# Patient Record
Sex: Female | Born: 1960 | Race: White | Hispanic: No | Smoking: Never smoker
Health system: Southern US, Community
[De-identification: ages and names within clinical notes are randomized; demographics above are authoritative.]

## PROBLEM LIST (undated history)

## (undated) DIAGNOSIS — O24419 Gestational diabetes mellitus in pregnancy, unspecified control: Secondary | ICD-10-CM

## (undated) DIAGNOSIS — O039 Complete or unspecified spontaneous abortion without complication: Secondary | ICD-10-CM

## (undated) DIAGNOSIS — O1490 Unspecified pre-eclampsia, unspecified trimester: Secondary | ICD-10-CM

## (undated) HISTORY — PX: DILATION AND CURETTAGE OF UTERUS: SHX78

---

## 1997-10-17 ENCOUNTER — Emergency Department (HOSPITAL_COMMUNITY): Admission: EM | Admit: 1997-10-17 | Discharge: 1997-10-17 | Payer: Self-pay | Admitting: Emergency Medicine

## 1999-09-20 ENCOUNTER — Encounter (INDEPENDENT_AMBULATORY_CARE_PROVIDER_SITE_OTHER): Payer: Self-pay

## 1999-09-20 ENCOUNTER — Ambulatory Visit (HOSPITAL_COMMUNITY): Admission: RE | Admit: 1999-09-20 | Discharge: 1999-09-20 | Payer: Self-pay | Admitting: Obstetrics & Gynecology

## 1999-11-06 ENCOUNTER — Other Ambulatory Visit: Admission: RE | Admit: 1999-11-06 | Discharge: 1999-11-06 | Payer: Self-pay | Admitting: Obstetrics and Gynecology

## 2000-02-03 ENCOUNTER — Encounter: Payer: Self-pay | Admitting: Obstetrics and Gynecology

## 2000-02-03 ENCOUNTER — Encounter: Admission: RE | Admit: 2000-02-03 | Discharge: 2000-02-03 | Payer: Self-pay | Admitting: Obstetrics and Gynecology

## 2000-08-25 ENCOUNTER — Other Ambulatory Visit: Admission: RE | Admit: 2000-08-25 | Discharge: 2000-08-25 | Payer: Self-pay | Admitting: Obstetrics and Gynecology

## 2000-11-06 ENCOUNTER — Encounter: Payer: Self-pay | Admitting: Obstetrics and Gynecology

## 2000-11-06 ENCOUNTER — Ambulatory Visit (HOSPITAL_COMMUNITY): Admission: RE | Admit: 2000-11-06 | Discharge: 2000-11-06 | Payer: Self-pay | Admitting: Obstetrics and Gynecology

## 2001-02-09 ENCOUNTER — Inpatient Hospital Stay (HOSPITAL_COMMUNITY): Admission: AD | Admit: 2001-02-09 | Discharge: 2001-02-09 | Payer: Self-pay | Admitting: Obstetrics and Gynecology

## 2001-02-18 ENCOUNTER — Encounter: Payer: Self-pay | Admitting: Obstetrics and Gynecology

## 2001-02-18 ENCOUNTER — Ambulatory Visit (HOSPITAL_COMMUNITY): Admission: RE | Admit: 2001-02-18 | Discharge: 2001-02-18 | Payer: Self-pay | Admitting: Obstetrics and Gynecology

## 2001-02-21 ENCOUNTER — Inpatient Hospital Stay (HOSPITAL_COMMUNITY): Admission: AD | Admit: 2001-02-21 | Discharge: 2001-02-23 | Payer: Self-pay | Admitting: Obstetrics and Gynecology

## 2001-02-24 ENCOUNTER — Encounter: Admission: RE | Admit: 2001-02-24 | Discharge: 2001-03-26 | Payer: Self-pay | Admitting: Obstetrics and Gynecology

## 2001-03-26 ENCOUNTER — Other Ambulatory Visit: Admission: RE | Admit: 2001-03-26 | Discharge: 2001-03-26 | Payer: Self-pay | Admitting: Obstetrics and Gynecology

## 2001-03-27 ENCOUNTER — Encounter: Admission: RE | Admit: 2001-03-27 | Discharge: 2001-04-26 | Payer: Self-pay | Admitting: Obstetrics and Gynecology

## 2001-05-12 ENCOUNTER — Encounter: Admission: RE | Admit: 2001-05-12 | Discharge: 2001-06-11 | Payer: Self-pay | Admitting: Obstetrics and Gynecology

## 2001-06-12 ENCOUNTER — Encounter: Admission: RE | Admit: 2001-06-12 | Discharge: 2001-07-12 | Payer: Self-pay | Admitting: Obstetrics and Gynecology

## 2001-07-13 ENCOUNTER — Encounter: Admission: RE | Admit: 2001-07-13 | Discharge: 2001-08-12 | Payer: Self-pay | Admitting: Obstetrics and Gynecology

## 2001-08-25 ENCOUNTER — Encounter: Admission: RE | Admit: 2001-08-25 | Discharge: 2001-09-24 | Payer: Self-pay | Admitting: Obstetrics and Gynecology

## 2001-10-10 ENCOUNTER — Encounter: Admission: RE | Admit: 2001-10-10 | Discharge: 2001-11-09 | Payer: Self-pay | Admitting: Obstetrics and Gynecology

## 2001-11-10 ENCOUNTER — Encounter: Admission: RE | Admit: 2001-11-10 | Discharge: 2001-12-10 | Payer: Self-pay | Admitting: Obstetrics and Gynecology

## 2001-12-25 ENCOUNTER — Encounter: Admission: RE | Admit: 2001-12-25 | Discharge: 2002-01-24 | Payer: Self-pay | Admitting: Obstetrics and Gynecology

## 2002-01-25 ENCOUNTER — Encounter: Admission: RE | Admit: 2002-01-25 | Discharge: 2002-02-24 | Payer: Self-pay | Admitting: Obstetrics and Gynecology

## 2003-04-07 ENCOUNTER — Other Ambulatory Visit: Admission: RE | Admit: 2003-04-07 | Discharge: 2003-04-07 | Payer: Self-pay | Admitting: Obstetrics and Gynecology

## 2003-08-29 ENCOUNTER — Encounter: Admission: RE | Admit: 2003-08-29 | Discharge: 2003-08-29 | Payer: Self-pay | Admitting: Obstetrics and Gynecology

## 2003-10-03 ENCOUNTER — Encounter: Payer: Self-pay | Admitting: Obstetrics and Gynecology

## 2003-10-03 ENCOUNTER — Ambulatory Visit (HOSPITAL_COMMUNITY): Admission: RE | Admit: 2003-10-03 | Discharge: 2003-10-03 | Payer: Self-pay | Admitting: Obstetrics and Gynecology

## 2003-10-11 ENCOUNTER — Ambulatory Visit (HOSPITAL_COMMUNITY): Admission: RE | Admit: 2003-10-11 | Discharge: 2003-10-11 | Payer: Self-pay | Admitting: Obstetrics and Gynecology

## 2003-10-19 ENCOUNTER — Ambulatory Visit (HOSPITAL_COMMUNITY): Admission: RE | Admit: 2003-10-19 | Discharge: 2003-10-19 | Payer: Self-pay | Admitting: Obstetrics and Gynecology

## 2003-10-20 ENCOUNTER — Inpatient Hospital Stay (HOSPITAL_COMMUNITY): Admission: RE | Admit: 2003-10-20 | Discharge: 2003-10-23 | Payer: Self-pay | Admitting: Obstetrics and Gynecology

## 2003-10-24 ENCOUNTER — Encounter: Admission: RE | Admit: 2003-10-24 | Discharge: 2003-11-23 | Payer: Self-pay | Admitting: Obstetrics and Gynecology

## 2003-11-24 ENCOUNTER — Encounter: Admission: RE | Admit: 2003-11-24 | Discharge: 2003-12-24 | Payer: Self-pay | Admitting: Obstetrics and Gynecology

## 2003-12-25 ENCOUNTER — Encounter: Admission: RE | Admit: 2003-12-25 | Discharge: 2004-01-24 | Payer: Self-pay | Admitting: Obstetrics and Gynecology

## 2004-01-25 ENCOUNTER — Encounter: Admission: RE | Admit: 2004-01-25 | Discharge: 2004-02-24 | Payer: Self-pay | Admitting: Obstetrics and Gynecology

## 2004-03-26 ENCOUNTER — Encounter: Admission: RE | Admit: 2004-03-26 | Discharge: 2004-04-25 | Payer: Self-pay | Admitting: Obstetrics and Gynecology

## 2004-04-12 ENCOUNTER — Ambulatory Visit: Payer: Self-pay | Admitting: Internal Medicine

## 2004-05-26 ENCOUNTER — Encounter: Admission: RE | Admit: 2004-05-26 | Discharge: 2004-06-25 | Payer: Self-pay | Admitting: Obstetrics and Gynecology

## 2004-06-26 ENCOUNTER — Encounter: Admission: RE | Admit: 2004-06-26 | Discharge: 2004-07-26 | Payer: Self-pay | Admitting: Obstetrics and Gynecology

## 2004-07-16 ENCOUNTER — Ambulatory Visit: Payer: Self-pay | Admitting: Internal Medicine

## 2004-07-16 ENCOUNTER — Encounter: Admission: RE | Admit: 2004-07-16 | Discharge: 2004-07-16 | Payer: Self-pay | Admitting: Internal Medicine

## 2004-08-24 ENCOUNTER — Encounter: Admission: RE | Admit: 2004-08-24 | Discharge: 2004-09-23 | Payer: Self-pay | Admitting: Obstetrics and Gynecology

## 2004-10-24 ENCOUNTER — Encounter: Admission: RE | Admit: 2004-10-24 | Discharge: 2004-11-23 | Payer: Self-pay | Admitting: Obstetrics and Gynecology

## 2004-12-24 ENCOUNTER — Encounter: Admission: RE | Admit: 2004-12-24 | Discharge: 2005-01-23 | Payer: Self-pay | Admitting: Obstetrics and Gynecology

## 2005-01-24 ENCOUNTER — Encounter: Admission: RE | Admit: 2005-01-24 | Discharge: 2005-02-22 | Payer: Self-pay | Admitting: Obstetrics and Gynecology

## 2005-02-07 ENCOUNTER — Other Ambulatory Visit: Admission: RE | Admit: 2005-02-07 | Discharge: 2005-02-07 | Payer: Self-pay | Admitting: Obstetrics and Gynecology

## 2005-02-23 ENCOUNTER — Encounter: Admission: RE | Admit: 2005-02-23 | Discharge: 2005-03-10 | Payer: Self-pay | Admitting: Obstetrics and Gynecology

## 2005-02-25 ENCOUNTER — Ambulatory Visit (HOSPITAL_COMMUNITY): Admission: RE | Admit: 2005-02-25 | Discharge: 2005-02-25 | Payer: Self-pay | Admitting: Obstetrics and Gynecology

## 2005-03-07 ENCOUNTER — Encounter: Admission: RE | Admit: 2005-03-07 | Discharge: 2005-03-07 | Payer: Self-pay | Admitting: Obstetrics and Gynecology

## 2005-06-27 IMAGING — CR DG ANKLE COMPLETE 3+V*R*
3 series · 3 of 3 positions shown · non-contrast
Comparison: none

CLINICAL DATA: Pain and swelling after trauma.  
 RIGHT ANKLE: 
 Diffuse soft swelling is present about the ankle.  There are degenerative changes at the ankle joint.  Posterior and plantar calcaneal spurring are seen.  There is linear ossification at the posterior tibial malleolus which has the appearance of periosteal reaction.

[view not recorded (1 of 3)]
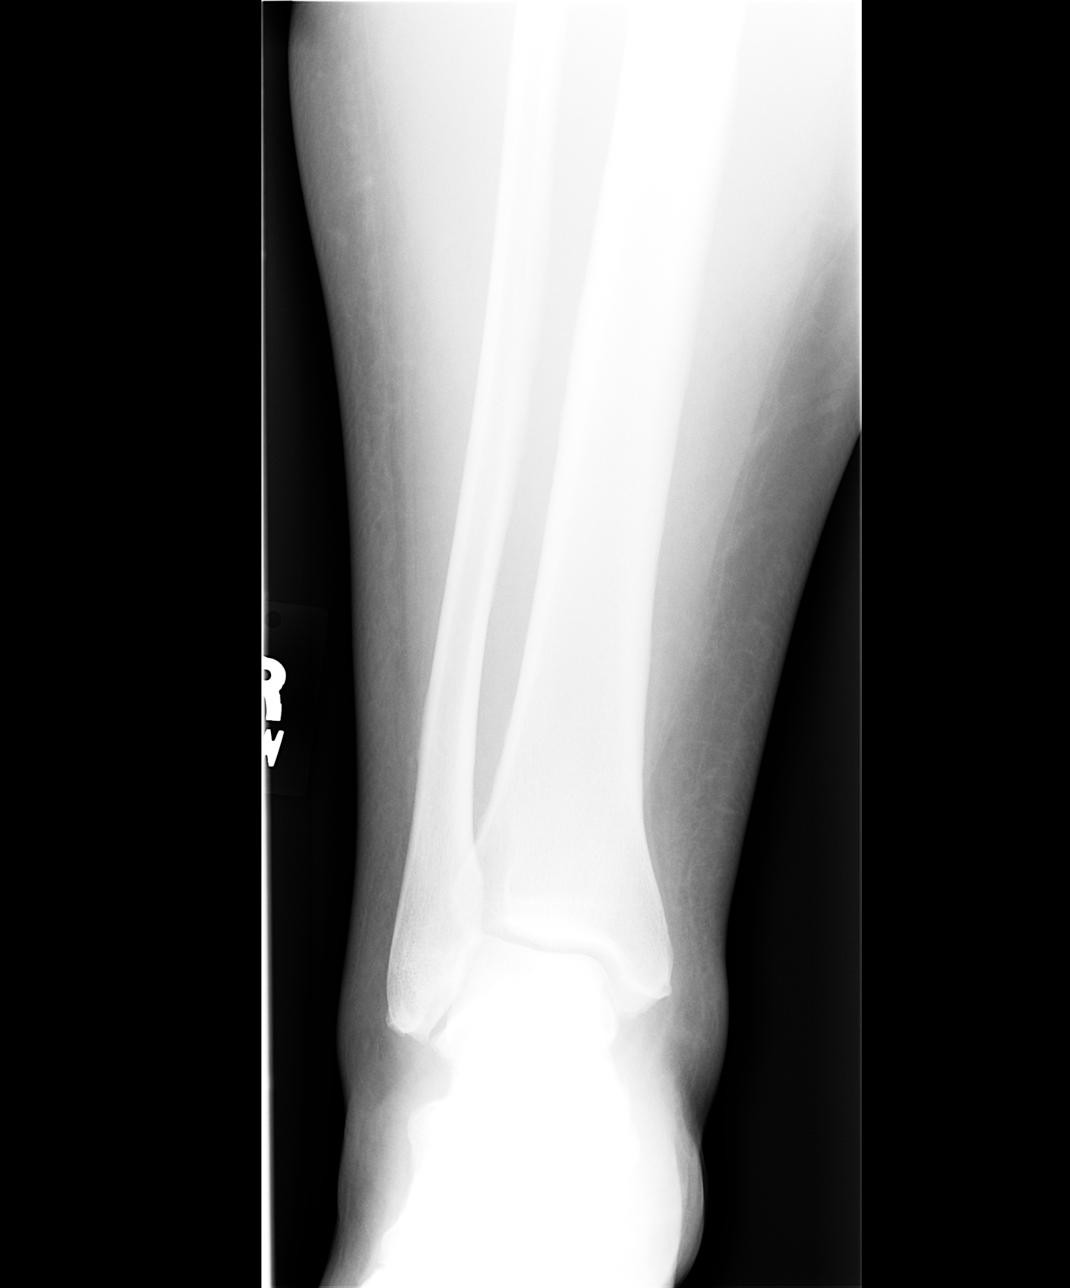

[view not recorded (2 of 3)]
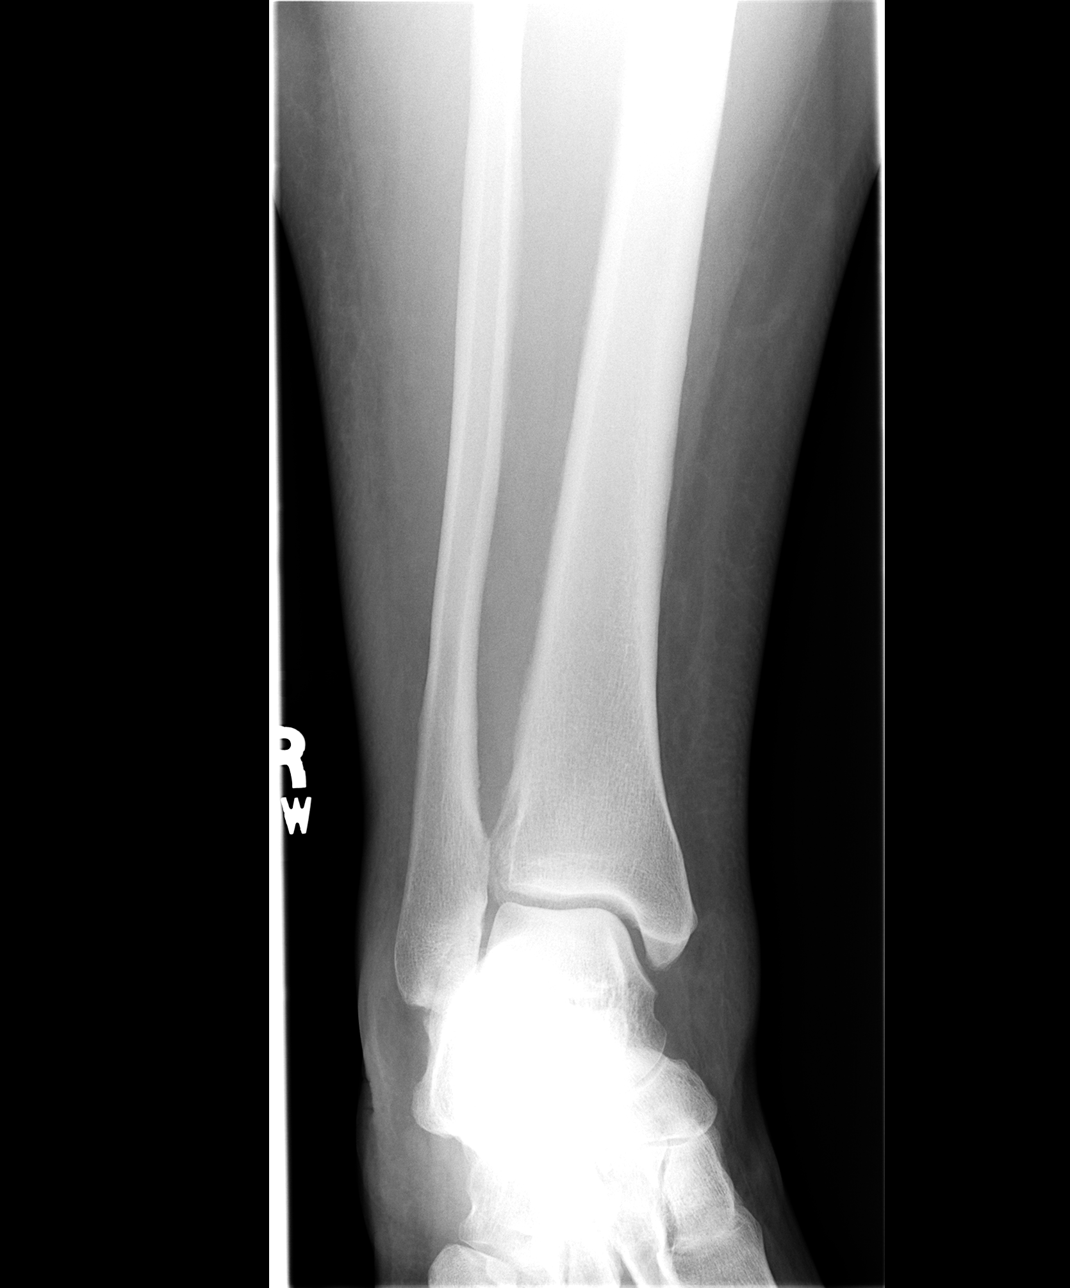

[view not recorded (3 of 3)]
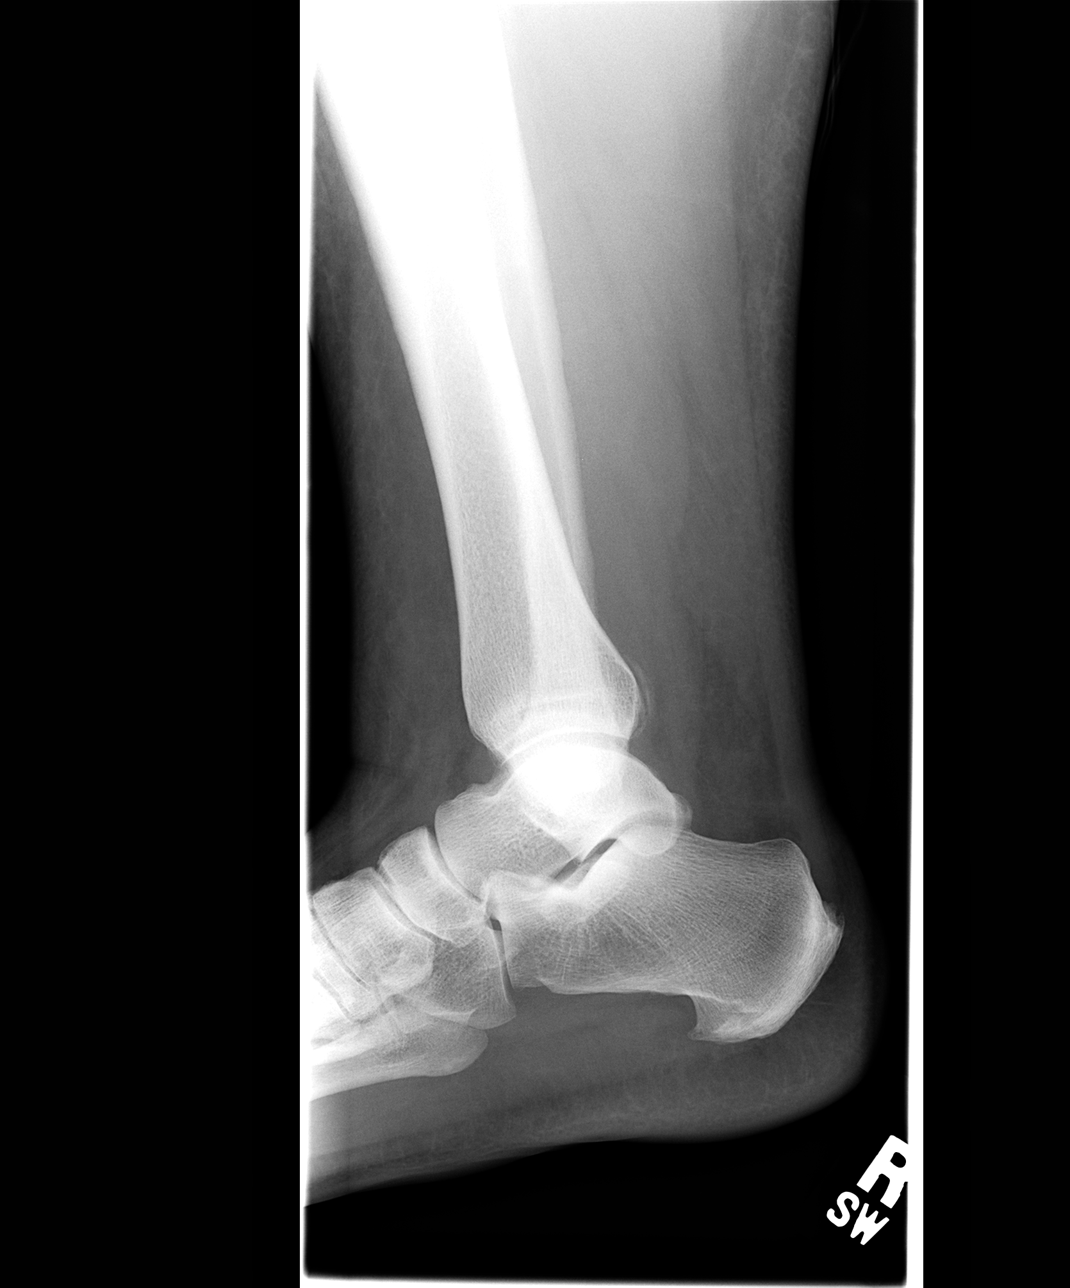

[3 of 3 positions shown; findings below may reference images not displayed]

IMPRESSION: 1.  Ossification at the posterior malleolus has the appearance of periosteal reaction, most likely from trauma.  A discrete fracture line cannot be identified, however.  
 2.  Diffuse swelling about the ankle.  
 3.  Hypertrophic degenerative changes.

## 2006-05-22 ENCOUNTER — Ambulatory Visit (HOSPITAL_COMMUNITY): Admission: RE | Admit: 2006-05-22 | Discharge: 2006-05-22 | Payer: Self-pay | Admitting: Obstetrics and Gynecology

## 2008-03-31 ENCOUNTER — Encounter: Admission: RE | Admit: 2008-03-31 | Discharge: 2008-03-31 | Payer: Self-pay | Admitting: Obstetrics and Gynecology

## 2010-10-11 NOTE — H&P (Signed)
NAME:  Amanda Graves, Amanda Graves NO.:  [id number]   MEDICAL RECORD NO.:  [id number]                   PATIENT TYPE:  OUT   LOCATION:  ULT                                  FACILITY:  [facility]   PHYSICIAN:  Crist Fat. Rivard, M.D.              DATE OF BIRTH:  24-Jun-1960   DATE OF ADMISSION:  10/19/2003  DATE OF DISCHARGE:                                HISTORY & PHYSICAL   This a 50 year old gravida 4, para 1, 0 2 1 at 28 and 0/7 weeks who presents  for an elective primary cesarean section.  Her pregnancy has been followed  by Dr. Estanislado Pandy and remarkable for:  1. AMA.  2. Insulin-dependent diabetes.  3. History of a 36-week delivery.  4. Obesity.  5. History of PIH.  6. Conception on Clomid.  7. Group B strep negative.    OB HISTORY:  Remarkable for spontaneous abortions in 1984 and 2001.  She had  a vacuum assisted delivery in 2002 of a female infant at [redacted] weeks gestation,  weighing 7 lb, 12 oz, remarkable for pregnancy induced hypertension and  difficult delivery with a third-degree laceration.   MEDICAL HISTORY:  Remarkable for:  1. Pregnancy induced hypertension.  2. History of fibrocystic breasts.  3. History of infertility, treated with Clomid and IUI.   SURGICAL HISTORY:  Remarkable for wisdom teeth in 2000.   FAMILY HISTORY:  Remarkable for a mother with heart disease, hypertension,  and colon and bladder cancer.  Brother has COPD.  Mother has diabetes and  hypothyroidism.  Father has jaw cancer and prostate cancer.  Grandmother has  breast cancer.   GENETIC HISTORY:  Unremarkable.   SOCIAL HISTORY:  The patient is married to Wardell Heath who is involved and  supportive.  She is of the [employer].  She denies any alcohol,  tobacco, or drug use.   PRENATAL LABORATORY STUDIES:  Hemoglobin 13.1.  Platelets 229,000.  Blood  type O positive.  Antibody screen negative.  RPR nonreactive.  Rubella  immune.  Hepatitis negative.  HIV negative.  Toxo  titer is negative.  Group  B strep negative.   HISTORY OF CURRENT PREGNANCY:  The patient entered care at [redacted] weeks  gestation.  She was on insulin throughout the pregnancy and the doses were  adjusted according to her increasing needs.  She had some preterm  contractions at 19 weeks, which resolved.  She had some episodes of pink  discharge at 24 weeks with a negative fetal fibronectin.  Her diabetes was  gestational and she was placed on glucose control.  Insulin dosages needed  to be increased throughout the pregnancy to gain control.  The decision was  made for an elective C-section for this delivery and the patient is  currently preparing for that.   ASSESSMENT:  1. Intrauterine pregnancy at 38 weeks.  2. Previous difficult vaginal delivery.  3. Desires elective primary cesarean section.  PLAN:  1. Admit to operating suites, per Dr. Estanislado Pandy.  2. Routine OR orders.     Marie L. Williams, C.N.M.                 Crist Fat Rivard, M.D.    MLW/MEDQ  D:  10/20/2003  T:  10/20/2003  Job:  [id number]

## 2010-10-11 NOTE — Discharge Summary (Signed)
NAME:  Amanda Graves, Amanda Graves                    ACCOUNT NO.:  [id number]   MEDICAL RECORD NO.:  [id number]                   PATIENT TYPE:  INP   LOCATION:  9136                                 FACILITY:  [facility]   PHYSICIAN:  Crist Fat. Rivard, M.D.              DATE OF BIRTH:  Oct 15, 1960   DATE OF ADMISSION:  10/20/2003  DATE OF DISCHARGE:  10/23/2003                                 DISCHARGE SUMMARY   ADMISSION DIAGNOSES:  1. Intrauterine pregnancy at 38-1/7 weeks.  2. Class A2 diabetes.  3. Primary cesarean section for history of shoulder dystocia.   DISCHARGE DIAGNOSES:  1. Intrauterine pregnancy at 38-1/7 weeks.  2. Class A2 diabetes.  3. Primary cesarean section for history of shoulder dystocia.  4. Macrosomia.   PROCEDURE:  Spinal anesthesia.  Primary low transverse cesarean section.  Inverted T extension of incision.   HOSPITAL COURSE:  The patient was admitted for an elective primary cesarean  section for a history of shoulder dystocia with the prior delivery.  She  underwent a primary low transverse cesarean section which required an  inverted T extension in order to deliver the female infant named Lily,  Apgars 8 and 9, weighing 9 pounds 1 ounce.  Surgery was otherwise well  tolerated and without complications.  On postoperative day #1, the patient  was ambulating and eating.  Foley was discontinued.  The Glucomander was  discontinued.  She was draining small amounts of fluid through her JP drain.  The JP drain was removed on postoperative day #2.  The patient continued to  improve.  Her fasting blood sugar was 111 and postprandial blood sugars  ranged 136 to 139.  On postoperative day #3, the patient's postprandial  breakfast blood sugar was 174.  She was on sliding scale insulin.  Vital  signs were stable and she was afebrile.  Fasting blood sugar was 102.  Abdomen was soft, the incision was clean and dry. Lungs were clear.  Heart  was regular rate and rhythm.   Extremities within normal limits.  The patient  was deemed to have received the full benefit of her hospital stay and was  discharged home.   DISCHARGE MEDICATIONS:  1. Motrin 600 mg p.o. q.6h p.r.n.  2. Tylox one to two p.o. q.4h. p.r.n.  3. NPH Insulin 10 units subcu q.h.s. with sliding scale insulin as needed.   DISCHARGE LABORATORY DATA:  White blood cell count 11.3, hemoglobin 9.1,  platelet count 191.   DISCHARGE INSTRUCTIONS:  Per [facility] handout.   FOLLOW UP:  The patient will fax her list of blood sugar results to Dr.  Estanislado Pandy on Monday and then have her regular six-week visit at [facility] or as needed.     Marie L. Williams, C.N.M.                 Crist Fat Rivard, M.D.    MLW/MEDQ  D:  10/23/2003  T:  10/23/2003  Job:  [id number]

## 2010-10-11 NOTE — H&P (Signed)
[facility]  Patient:    Amanda Graves, Amanda Graves Visit Number: [id number] MRN: [id number]          Service Type: OBS Location: 910B [id number] [id number] Attending Physician:  Esmeralda Arthur Dictated by:   Silverio Lay, M.D. Admit Date:  02/21/2001                           History and Physical  REASON FOR ADMISSION:         Intrauterine pregnancy at 36 weeks and 3 days, in spontaneous labor.  HISTORY OF PRESENT ILLNESS:   This is a 50 year old married white female, gravida 3, para 0, abortus 2, with a due date of March 18, 2001, admitted at 36 weeks and 3 days for spontaneous labor which started with regular uterine contractions around 1:30 a.m. this morning, as well as a history of leaking clear fluid, one gush.  She reports good fetal activity.  Denies any bleeding. Denies any pregnancy induced hypertension symptoms.  She was last seen in the office last week with an ultrasound revealing an estimated fetal weight of 8 pounds 9 ounces, with polyhydramnios and her cervix at that time was closed and thick.  She has also been followed closely in the last four weeks for borderline hypertension with PIH labs within normal limits and with blood pressure responding to rest with normalization of her diastolic values.  PRENATAL COURSE:              Blood type O positive, toxoplasmosis negative, RPR nonreactive, rubella immune, HBsAg negative, HIV nonreactive, Pap smear within normal limits, gonorrhea negative, Chlamydia negative.  First trimester ultrasound confirmed due date.  First trimester ultrasound revealed right corpus luteum cyst and left ovarian cyst measuring 7.9 x 6.8 cm.  At 16 weeks, ultrasound revealed a decrease in that ovarian cyst to 2.8 x 1.65 x 2.5 cm.  A 16 weeks AFP was within normal limits.  The patient declined amniocentesis. Sixteen weeks one hour glucose tolerance test was within normal limits. Twenty weeks ultrasound revealed a limited  anatomy survey due to maternal body habitus and fetal position, normal cervical length, and fundal placenta which appeared to be normal.  The patient was then referred to [facility] for a level II ultrasound to complete anatomy survey, which was completely normal. Twenty-eight weeks glucose tolerance test was within normal limits.  Group B Strep is currently pending.  Prenatal course was otherwise uneventful.  PAST MEDICAL HISTORY:         1.  1984, spontaneous miscarriage at eight                                   weeks, no complications.                               2.  April 2001, blighted ovum at seven weeks,                                   no complications.                               3.  Long history of fertility, patient conceived  with Clomid and artificial insemination.                               4.  Obesity.  FAMILY HISTORY:               Father with coronary artery disease and prostate cancer.  Mother with hypertension, insulin dependent diabetes, and colon cancer.  The patients niece was born with chromosome-16 anomaly and cardiac malformation.  SOCIAL HISTORY:               Married, nonsmoker, works as IT trainer.  PHYSICAL EXAMINATION:  VITAL SIGNS:                  Current weight is 315 pounds.  HEENT:                        Negative.  LUNGS:                        Clear.  HEART:                        Normal.  ABDOMEN:                      Gravid, with the fundal height above 45.  VAGINAL EXAM:                 2-3 cm, 100% effaced, vertex -1.  Fetal heart rate tracings reactive.  EXTREMITIES:                  Negative.  ASSESSMENT:                   Intrauterine pregnancy at 36 weeks and 3 days, in active labor with possible rupture of membrane.  Group B streptococcus pending.  PLAN:                         The patient will be admitted to labor and delivery.  Pain managements.  Spontaneous vaginal delivery expected.  She  will be started on antibiotics for unknown group B strep status.  Dictated by: Silverio Lay, M.D. Attending Physician:  Esmeralda Arthur DD:  02/21/01 TD:  02/21/01 Job: [id number]

## 2010-10-11 NOTE — Op Note (Signed)
[facility]  Patient:    Amanda Graves, Amanda Graves                   MRN: [id number] Proc. Date: 09/20/99 Adm. Date:  [id number] Disc. Date: [id number] Attending:  Genia Del                           Operative Report  DATE OF BIRTH:                [birth year].  PREOPERATIVE DIAGNOSIS:       Blighted ovum.  POSTOPERATIVE DIAGNOSIS:      Blighted ovum.  INTERVENTION:                 Dilatation and evacuation with aspiration.  ANESTHESIOLOGIST:             Gretta Cool., M.D.  SURGEON:                      Genia Del, M.D.  ANESTHESIA:  DESCRIPTION OF PROCEDURE:     Under MAC with paracervical block, the patient is in lithotomy position.  She is prepped with Betadine on the suprapubic, vulvar and  vaginal areas, then draped as usual.  The vaginal exam is limited by obesity. o mass palpated.  The speculum is introduced.  Paracervical block is done with lidocaine 1%, 20 cc total, at 4 oclock and 8 oclock, then the anterior lip of the cervix is grasped with a tenaculum.  Dilatation is done easily with Hegar dilators up to #31, then a #8 curved curette is used for aspiration.  Products of conception are brought out, corresponding to about 6 weeks, and they were sent to pathology. Then, a sharp curette is used to curette systematically all surfaces of the uterine cavity and we finish with the aspiration curette again.  The estimated blood loss was about 30 cc.  The instruments were removed.  No complication occurred and the patient was brought to the recovery room in good status.  Her blood group is O-positive. DD:  09/20/99 TD:  09/23/99 Job: [id number]

## 2010-10-11 NOTE — Op Note (Signed)
NAME:  Amanda Graves, Amanda Graves                    ACCOUNT NO.:  [id number]   MEDICAL RECORD NO.:  [id number]                   PATIENT TYPE:  INP   LOCATION:  9136                                 FACILITY:  [facility]   PHYSICIAN:  Crist Fat. Rivard, M.D.              DATE OF BIRTH:  [birth year]   DATE OF PROCEDURE:  10/20/2003  DATE OF DISCHARGE:                                 OPERATIVE REPORT   PREOPERATIVE DIAGNOSES:  1. Intrauterine pregnancy at 38 weeks 2 days.  2. Insulin-requiring gestational diabetes.  3. Suspected large for gestational age.  4. Previous history of severe shoulder dystocia.   POSTOPERATIVE DIAGNOSES:  1. Intrauterine pregnancy at 38 weeks 2 days.  2. Insulin-requiring gestational diabetes.  3. Suspected large for gestational age.  4. Previous history of severe shoulder dystocia.   ANESTHESIA:  Spinal, Dr. Jean Rosenthal.   PROCEDURE:  Primary low transverse cesarean section with inverted T  extension.   SURGEON:  Crist Fat. Rivard, M.D.   ASSISTANT:  Elby Showers. Williams, C.N.M.   ESTIMATED BLOOD LOSS:  1000 mL.   PROCEDURE:  After being informed of the planned procedure with possible  complications including bleeding, infection, injury to bowel, bladder, or  ureters, informed consent was obtained.  The patient was taken to cesarean  suite, given spinal anesthesia without any complication.   The patient is then placed in a dorsal decubitus position, pelvis tilted to  the left.  She is prepped and draped in a sterile fashion, and a Foley  catheter is inserted in her bladder.  She is already on Glucommander  protocol with a fasting blood sugar of 84.  After assessing adequate level  of anesthesia, we infiltrate the suprapubic area with 20 mL of Marcaine  0.25% and perform a Pfannenstiel incision, which is brought down to the  fascia.  The fascia is incised in a low transverse fashion, linea alba is  dissected.  At this time we encounter bleeding in a retracted fascial  blood  vessel, which is controlled with a figure-of-eight stitch of 0 Vicryl.  The  peritoneum is entered in a midline fashion.  The visceral peritoneum is  entered in a low transverse fashion, allowing [place] to safely retract bladder  by developing a bladder flap.  Myometrium is then entered in a low  transverse fashion, first with knife, then bluntly.  Amniotic fluid is clear  and abundant.  We attempt delivery of the baby with fundal pressure, which  is unsuccessful.  We then attempt to place a vacuum on the vertex  presentation and failed twice to deliver the baby.  Kjelland forceps are  then placed on each side and again we encounter dystocia, and it is  impossible to deliver the baby, and decision is then made to extend the  incision with an inverted T using bandage scissors on a distance of about 5-  6 cm.  This allows [place] to deliver the baby, to  reduce two nuchal cords,  suction mouth and nose, deliver the body of the baby, clamp the cord with  two Kelly clamps and section, and give the baby to the pediatrician present  in the room, Dr. Alison Murray.   Twenty milliliters of blood is drawn from the umbilical vein, and the  placenta is allowed to deliver spontaneously.  It is complete.  The cord has  three vessels, and uterine revision is completely negative.  The uterus is  exteriorized from the abdominal cavity.  Both tubes and ovaries are normal.  We proceed with closure of the extended inverted T in four different layers  with the first two layers running locked suture of 0 Vicryl, and the two  later layers of figure-of-eight stitches of 0 Vicryl.  The serosa on that  incision is then closed with a baseball stitch of 3-0 Vicryl.  Hemostasis is  adequate.  We then proceed with closure of the low transverse incision in  two layers, first with a running locked suture of 0 Vicryl, then with a  Lembert suture of 0 Vicryl covering the first one.  We now have to complete  hemostasis with  multiple figure-of-eight stitches midline on the low  transverse incision as well as on the right angle of the incision.  All of  these are with 0 Vicryl.  Hemostasis is then felt to be adequate.  The  uterus is returned in the pelvic cavity.  Both pelvic gutters are cleansed.  Pelvis is irrigated profusely with warm saline.  Hemostasis is rechecked and  adequate.  A sheet of Interceed is placed on the vertical uterine incision  to reduce the risk of adhesions.  Under fascia hemostasis is then completed  with cautery, and fascia is closed with two running sutures of 1 Vicryl  meeting midline.  The incision is then irrigated with warm saline.  Hemostasis is completed with cautery.  Via a left contra-incision, a #10  flat Jackson-Pratt is inserted in the incision and fixed to the incision  with a 3-0 silk.  The incision is then closed with a subcuticular suture of  3-0 Monocryl and Steri-Strips.   Instrument and sponge count is complete x2.  Estimated blood loss is 1000  mL.  The procedure is well-tolerated by the patient, who is taken to the  recovery room in a well and stable condition.   A little girl named Tonna Corner was born at 12:54, received an Apgar of 8 at one  minute and 9 at five minutes, and weighed 9 pounds 1 ounce.                                               Crist Fat Rivard, M.D.    SAR/MEDQ  D:  10/20/2003  T:  10/21/2003  Job:  [id number]

## 2013-05-18 ENCOUNTER — Encounter: Payer: Self-pay | Admitting: Nurse Practitioner

## 2014-04-17 ENCOUNTER — Other Ambulatory Visit: Payer: Self-pay | Admitting: Nurse Practitioner

## 2014-04-17 DIAGNOSIS — Z1231 Encounter for screening mammogram for malignant neoplasm of breast: Secondary | ICD-10-CM

## 2017-06-27 ENCOUNTER — Emergency Department (HOSPITAL_COMMUNITY): Payer: BLUE CROSS/BLUE SHIELD

## 2017-06-27 ENCOUNTER — Other Ambulatory Visit: Payer: Self-pay

## 2017-06-27 ENCOUNTER — Observation Stay (HOSPITAL_COMMUNITY)
Admission: EM | Admit: 2017-06-27 | Discharge: 2017-06-28 | Disposition: A | Discharge status: Home / Self Care | Payer: BLUE CROSS/BLUE SHIELD | Attending: Internal Medicine | Admitting: Internal Medicine

## 2017-06-27 ENCOUNTER — Encounter (HOSPITAL_COMMUNITY): Payer: Self-pay

## 2017-06-27 DIAGNOSIS — R55 Syncope and collapse: Principal | ICD-10-CM | POA: Insufficient documentation

## 2017-06-27 DIAGNOSIS — Z6841 Body Mass Index (BMI) 40.0 and over, adult: Secondary | ICD-10-CM | POA: Insufficient documentation

## 2017-06-27 DIAGNOSIS — E669 Obesity, unspecified: Secondary | ICD-10-CM | POA: Insufficient documentation

## 2017-06-27 DIAGNOSIS — R42 Dizziness and giddiness: Secondary | ICD-10-CM | POA: Diagnosis not present

## 2017-06-27 DIAGNOSIS — Z8632 Personal history of gestational diabetes: Secondary | ICD-10-CM | POA: Diagnosis not present

## 2017-06-27 DIAGNOSIS — R000 Tachycardia, unspecified: Secondary | ICD-10-CM | POA: Diagnosis present

## 2017-06-27 DIAGNOSIS — I472 Ventricular tachycardia, unspecified: Secondary | ICD-10-CM

## 2017-06-27 DIAGNOSIS — Z8249 Family history of ischemic heart disease and other diseases of the circulatory system: Secondary | ICD-10-CM | POA: Diagnosis not present

## 2017-06-27 DIAGNOSIS — Z885 Allergy status to narcotic agent status: Secondary | ICD-10-CM | POA: Insufficient documentation

## 2017-06-27 HISTORY — DX: Unspecified pre-eclampsia, unspecified trimester: O14.90

## 2017-06-27 HISTORY — DX: Gestational diabetes mellitus in pregnancy, unspecified control: O24.419

## 2017-06-27 HISTORY — DX: Complete or unspecified spontaneous abortion without complication: O03.9

## 2017-06-27 LAB — CBC
HCT: 42.7 % (ref 36.0–46.0)
Hemoglobin: 14.5 g/dL (ref 12.0–15.0)
MCH: 29.2 pg (ref 26.0–34.0)
MCHC: 34 g/dL (ref 30.0–36.0)
MCV: 85.9 fL (ref 78.0–100.0)
PLATELETS: 244 10*3/uL (ref 150–400)
RBC: 4.97 MIL/uL (ref 3.87–5.11)
RDW: 13 % (ref 11.5–15.5)
WBC: 9.7 10*3/uL (ref 4.0–10.5)

## 2017-06-27 LAB — I-STAT CHEM 8, ED
BUN: 15 mg/dL (ref 6–20)
CREATININE: 0.7 mg/dL (ref 0.44–1.00)
Calcium, Ion: 1.15 mmol/L (ref 1.15–1.40)
Chloride: 107 mmol/L (ref 101–111)
Glucose, Bld: 118 mg/dL — ABNORMAL HIGH (ref 65–99)
HEMATOCRIT: 46 % (ref 36.0–46.0)
HEMOGLOBIN: 15.6 g/dL — AB (ref 12.0–15.0)
POTASSIUM: 3.7 mmol/L (ref 3.5–5.1)
Sodium: 144 mmol/L (ref 135–145)
TCO2: 24 mmol/L (ref 22–32)

## 2017-06-27 LAB — I-STAT TROPONIN, ED: Troponin i, poc: 0.01 ng/mL (ref 0.00–0.08)

## 2017-06-27 LAB — I-STAT BETA HCG BLOOD, ED (MC, WL, AP ONLY)

## 2017-06-27 LAB — MAGNESIUM: MAGNESIUM: 2.2 mg/dL (ref 1.7–2.4)

## 2017-06-27 MED ORDER — POTASSIUM CHLORIDE IN NACL 20-0.9 MEQ/L-% IV SOLN
INTRAVENOUS | Status: DC
  Administered 2017-06-28: 01:00:00 via INTRAVENOUS
  Filled 2017-06-27 (×2): qty 1000

## 2017-06-27 MED ORDER — ONDANSETRON HCL 4 MG PO TABS: 4.0000 mg | ORAL_TABLET | Freq: Four times a day (QID) | ORAL | Status: DC | PRN

## 2017-06-27 MED ORDER — ACETAMINOPHEN 650 MG RE SUPP: 650.0000 mg | Freq: Four times a day (QID) | RECTAL | Status: DC | PRN

## 2017-06-27 MED ORDER — MAGNESIUM SULFATE 2 GM/50ML IV SOLN
2.0000 g | Freq: Once | INTRAVENOUS | Status: AC
  Administered 2017-06-27: 2 g via INTRAVENOUS
  Filled 2017-06-27: qty 50

## 2017-06-27 MED ORDER — POLYETHYLENE GLYCOL 3350 17 G PO PACK: 17.0000 g | PACK | Freq: Every day | ORAL | Status: DC | PRN

## 2017-06-27 MED ORDER — ONDANSETRON HCL 4 MG/2ML IJ SOLN: 4.0000 mg | Freq: Four times a day (QID) | INTRAMUSCULAR | Status: DC | PRN

## 2017-06-27 MED ORDER — ACETAMINOPHEN 325 MG PO TABS
650.0000 mg | ORAL_TABLET | Freq: Four times a day (QID) | ORAL | Status: DC | PRN
  Administered 2017-06-27: 650 mg via ORAL
  Filled 2017-06-27: qty 2

## 2017-06-27 MED ORDER — ENOXAPARIN SODIUM 40 MG/0.4ML ~~LOC~~ SOLN
40.0000 mg | Freq: Every day | SUBCUTANEOUS | Status: DC
  Administered 2017-06-27: 40 mg via SUBCUTANEOUS
  Filled 2017-06-27: qty 0.4

## 2017-06-27 NOTE — ED Notes (Signed)
Dr. Haroldine Lawsrossley, hospitalist, at bedside.

## 2017-06-27 NOTE — ED Triage Notes (Signed)
Pt reports feeling dizzy and faint after cleaning today. She checked her BP and HR and noticed that her HR has been up and down at rest all day. It stayed between 92-140s in triage. No hx of afib known. A&Ox4. No LOC today. She does reports ear congestion starting last week and has been taking guaifenesin.

## 2017-06-27 NOTE — ED Provider Notes (Signed)
[facility]-EMERGENCY DEPT Provider Note   CSN: [id number] Arrival date & time: 06/27/17  1646     History   Chief Complaint Chief Complaint  Patient presents with  . Irregular Heart Beat    HPI Amanda Graves is a 57 y.o. female.  Patient was working cleaning up her home today when she had several periods of sensation of fast heartbeat associated with lightheadedness and losing vision.  These episodes were brief.  At one point she checked her heart rate and it was 160 on a blood pressure monitor.  Her blood pressure was also elevated, when she checked it but then gradually decrease to 120/80.  No other preceding symptoms or associated symptoms.  At times she has onset of the discomfort when she moves her head.  She came here by private vehicle for evaluation.  She has been able to eat today without problems.  She denies fever, chills, nausea, vomiting, shortness of breath, chest pain, focal weakness or paresthesia.  No prior similar problems.  HPI  History reviewed. No pertinent past medical history.  There are no active problems to display for this patient.     OB History    No data available       Home Medications    Prior to Admission medications   Medication Sig Start Date End Date Taking? Authorizing Provider  Chlorphen-Phenyleph-ASA (ALKA-SELTZER PLUS COLD) 2-7.8-325 MG TBEF Take 1-2 tablets by mouth 2 (two) times daily as needed (cold symtptoms).   Yes [provider]  CYANOCOBALAMIN PO Take 1 tablet by mouth daily.   Yes [provider]  Multiple Vitamin (MULTIVITAMIN WITH MINERALS) TABS tablet Take 1 tablet by mouth daily.   Yes [provider]    Family History History reviewed. No pertinent family history.  Social History Social History   Tobacco Use  . Smoking status: Not on file  Substance Use Topics  . Alcohol use: Not on file  . Drug use: Not on file     Allergies     Hydrocodone-acetaminophen   Review of Systems Review of Systems  All other systems reviewed and are negative.    Physical Exam Updated Vital Signs BP (!) 142/81 (BP Location: Left Arm)   Pulse 84   Temp 98.3 F (36.8 C) (Oral)   Resp 13   SpO2 97%   Physical Exam  Constitutional: She is oriented to person, place, and time. She appears well-developed. She appears distressed (She is uncomfortable).  Overweight  HENT:  Head: Normocephalic and atraumatic.  Eyes: Conjunctivae and EOM are normal. Pupils are equal, round, and reactive to light.  Neck: Normal range of motion and phonation normal. Neck supple.  Cardiovascular: Normal rate and regular rhythm.  Pulmonary/Chest: Effort normal and breath sounds normal. She exhibits no tenderness.  Abdominal: Soft. She exhibits no distension. There is no tenderness. There is no guarding.  Musculoskeletal: Normal range of motion.  Neurological: She is alert and oriented to person, place, and time. She exhibits normal muscle tone.  Skin: Skin is warm and dry.  Psychiatric: Her behavior is normal. Judgment and thought content normal.  Anxious  Nursing note and vitals reviewed.    ED Treatments / Results  Labs (all labs ordered are listed, but only abnormal results are displayed) Labs Reviewed  I-STAT CHEM 8, ED - Abnormal; Notable for the following components:      Result Value   Glucose, Bld 118 (*)    Hemoglobin 15.6 (*)  All other components within normal limits  CBC  MAGNESIUM  I-STAT TROPONIN, ED  I-STAT BETA HCG BLOOD, ED ([facility], [facility], [facility] ONLY)    EKG  EKG Interpretation  Date/Time:  Saturday June 27 2017 17:17:20 EST Ventricular Rate:  102 PR Interval:    QRS Duration: 89 QT Interval:  303 QTC Calculation: 395 R Axis:   141 Text Interpretation:  Sinus tachycardia Ventricular tachycardia, unsustained Right axis deviation Low voltage, precordial leads No old tracing to compare Confirmed by Mancel BaleWentz, Emilio Baylock [phone]([id number])  on 06/27/2017 5:26:59 PM       Radiology Dg Chest Port 1 View  Result Date: 06/27/2017 CLINICAL DATA:  Syncope episode today; no known cardiopulmonary problems; non smoker EXAM: PORTABLE CHEST 1 VIEW COMPARISON:  None. FINDINGS: The heart size and mediastinal contours are within normal limits. Both lungs are clear. The visualized skeletal structures are unremarkable. IMPRESSION: No active disease. Electronically Signed   By: Norva PavlovElizabeth  Brown M.D.   On: 06/27/2017 18:04    Procedures Procedures (including critical care time)  Medications Ordered in ED Medications - No data to display   Initial Impression / Assessment and Plan / ED Course  I have reviewed the triage vital signs and the nursing notes.  Pertinent labs & imaging results that were available during my care of the patient were reviewed by me and considered in my medical decision making (see chart for details).  Clinical Course as of Jun 28 2123  Sat Jun 27, 2017  1740 Initial evaluation concerning tachyarrhythmias leading to intermittent near syncopal episodes.  [EW]    Clinical Course User Index [EW] Mancel BaleWentz, Kaylan Yates, MD    Patient Vitals for the past 24 hrs:  BP Temp Temp src Pulse Resp SpO2  06/27/17 1935 (!) 142/81 - - 84 13 97 %  06/27/17 1930 - - - 86 12 96 %  06/27/17 1915 - - - 85 11 99 %  06/27/17 1900 - - - 89 15 97 %  06/27/17 1714 (!) 136/94 98.3 F (36.8 C) Oral 95 16 98 %    8:36 PM Reevaluation with update and discussion. After initial assessment and treatment, an updated evaluation reveals review of cardiac monitor placed on arrival and treatment room, reveals a few scattered episodes of unifocal single PVCs.  Patient remains comfortable and has no further complaints. Mancel BaleElliott Umair Rosiles   Consult cardiology-20: 10-call returned at 21: 15.  Cardiology fellow recommends treating patient with IV magnesium 2 g, admit for observation on monitor overnight, treat tachycardia greater than 130 bpm with Lopressor 2.5  mg IV.  He recommends cardiology consultation in the morning, admitting hospitalist service to call for that tomorrow morning.  He anticipates cardiology consultation with outpatient recommendations for cardiac echo and further workup as needed.  The patient is felt to be stable for admission to [facility].  9:18 PM-Consult complete with hospitalist. Patient case explained and discussed. She agrees to admit patient for further evaluation and treatment. Call ended at 10:05 PM     Final Clinical Impressions(s) / ED Diagnoses   Final diagnoses:  Near syncope  Ventricular tachycardia (HCC)    Periods of near syncope, with palpitations, likely related to nonsustained ventricular tachycardia.  Patient with possibly a second arrhythmia, SVT versus frequent PACs as well.  She requires admission on telemetry, with cardiology consultation.  Doubt ACS, PE or pneumonia.  Nursing Notes Reviewed/ Care Coordinated Applicable Imaging Reviewed Interpretation of Laboratory Data incorporated into ED treatment  Plan: Admit  ED Discharge  Orders    None       Mancel Bale, MD 06/27/17 2206

## 2017-06-27 NOTE — Progress Notes (Signed)
Reviewed symptosm and  ED questions NSVT,  Brief  , then NSR Advised OVERNIGHT  OBS  ADMIT  , CARDS consult AM  , and  Overnight  If  Still any palpitations  Or NSVT, AMio gtt or  metoprolo 2.5 mg IV prn can be  Helpful ,  Troponin  Nl ,  No chest pain , No palpitations  Now.  ECHo   And possibly further  Ischemia  eval per  Card AM  Team

## 2017-06-27 NOTE — ED Notes (Signed)
ED TO INPATIENT HANDOFF REPORT  Name/Age/Gender Amanda Graves 57 y.o. female  Code Status Code Status History    This patient does not have a recorded code status. Please follow your organizational policy for patients in this situation.      Home/SNF/Other Home  Chief Complaint Elevated Heart Rate/ Dizziness/ Possible Faint   Level of Care/Admitting Diagnosis ED Disposition    ED Disposition Condition Comment   Admit  Hospital Area: [facility] [[id number]]  Level of Care: Telemetry [5]  Admit to tele based on following criteria: Complex arrhythmia (Bradycardia/Tachycardia)  Diagnosis: Postural dizziness with presyncope [1610960][[id number]]  Admitting Physician: Gery PrayROSLEY, DEBBY [[id number]]  Attending Physician: Gery PrayROSLEY, DEBBY [[id number]]  PT Class (Do Not Modify): Observation [104]  PT Acc Code (Do Not Modify): Observation [10022]       Medical History Past Medical History:  Diagnosis Date  . Gestational diabetes   . Miscarriage   . Preeclampsia     Allergies Allergies  Allergen Reactions  . Hydrocodone-Acetaminophen Other (See Comments)    Becomes antsy and unsettled     IV Location/Drains/Wounds Patient Lines/Drains/Airways Status   Active Line/Drains/Airways    Name:   Placement date:   Placement time:   Site:   Days:   Peripheral IV 06/27/17 Left Forearm   06/27/17    1813    Forearm   less than 1          Labs/Imaging Results for orders placed or performed during the hospital encounter of 06/27/17 (from the past 48 hour(s))  CBC     Status: None   Collection Time: 06/27/17  5:24 PM  Result Value Ref Range   WBC 9.7 4.0 - 10.5 K/uL   RBC 4.97 3.87 - 5.11 MIL/uL   Hemoglobin 14.5 12.0 - 15.0 g/dL   HCT 45.442.7 09.836.0 - 11.946.0 %   MCV 85.9 78.0 - 100.0 fL   MCH 29.2 26.0 - 34.0 pg   MCHC 34.0 30.0 - 36.0 g/dL   RDW 14.713.0 82.911.5 - 56.215.5 %   Platelets 244 150 - 400 K/uL    Comment: Performed at [facility], [address]  [place], [place] [address]  Magnesium     Status: None   Collection Time: 06/27/17  5:45 PM  Result Value Ref Range   Magnesium 2.2 1.7 - 2.4 mg/dL    Comment: Performed at [facility], [address] [place], [place] [address]  I-Stat beta hCG blood, ED     Status: None   Collection Time: 06/27/17  6:09 PM  Result Value Ref Range   I-stat hCG, quantitative <5.0 <5 mIU/mL   Comment 3            Comment:   GEST. AGE      CONC.  (mIU/mL)   <=1 WEEK        5 - 50     2 WEEKS       50 - 500     3 WEEKS       100 - 10,000     4 WEEKS     1,000 - 30,000        FEMALE AND NON-PREGNANT FEMALE:     LESS THAN 5 mIU/mL   I-stat troponin, ED     Status: None   Collection Time: 06/27/17  6:10 PM  Result Value Ref Range   Troponin i, poc 0.01 0.00 - 0.08 ng/mL   Comment 3  Comment: Due to the release kinetics of cTnI, a negative result within the first hours of the onset of symptoms does not rule out myocardial infarction with certainty. If myocardial infarction is still suspected, repeat the test at appropriate intervals.   I-stat Chem 8, ED     Status: Abnormal   Collection Time: 06/27/17  6:12 PM  Result Value Ref Range   Sodium 144 135 - 145 mmol/L   Potassium 3.7 3.5 - 5.1 mmol/L   Chloride 107 101 - 111 mmol/L   BUN 15 6 - 20 mg/dL   Creatinine, Ser 1.61 0.44 - 1.00 mg/dL   Glucose, Bld 096 (H) 65 - 99 mg/dL   Calcium, Ion 0.45 4.09 - 1.40 mmol/L   TCO2 24 22 - 32 mmol/L   Hemoglobin 15.6 (H) 12.0 - 15.0 g/dL   HCT 81.1 91.4 - 78.2 %   Dg Chest Port 1 View  Result Date: 06/27/2017 CLINICAL DATA:  Syncope episode today; no known cardiopulmonary problems; non smoker EXAM: PORTABLE CHEST 1 VIEW COMPARISON:  None. FINDINGS: The heart size and mediastinal contours are within normal limits. Both lungs are clear. The visualized skeletal structures are unremarkable. IMPRESSION: No active disease. Electronically Signed   By: Norva Pavlov M.D.   On: 06/27/2017  18:04    Pending Labs [employer] (From admission, onward)   Start     Ordered   Signed and Held  HIV antibody (Routine Testing)  Once,   R     Signed and Held   Signed and Held  CBC  (enoxaparin (LOVENOX)    CrCl >/= 30 ml/min)  Once,   R    Comments:  Baseline for enoxaparin therapy IF NOT ALREADY DRAWN.  Notify MD if PLT < 100 K.    Signed and Held   Signed and Held  Creatinine, serum  (enoxaparin (LOVENOX)    CrCl >/= 30 ml/min)  Once,   R    Comments:  Baseline for enoxaparin therapy IF NOT ALREADY DRAWN.    Signed and Held   Signed and Held  Creatinine, serum  (enoxaparin (LOVENOX)    CrCl >/= 30 ml/min)  Weekly,   R    Comments:  while on enoxaparin therapy    Signed and Held   Signed and Held  Magnesium  Tomorrow morning,   R     Signed and Held   Signed and Held  TSH  Tomorrow morning,   R     Signed and Held   Signed and Held  Basic metabolic panel  Tomorrow morning,   R     Signed and Held   Signed and Held  Troponin I  Now then every 6 hours,   R     Signed and Held      Vitals/Pain Today's Vitals   06/27/17 2030 06/27/17 2100 06/27/17 2130 06/27/17 2200  BP: 132/73 130/78 132/78 (!) 145/77  Pulse: 81 90 85 94  Resp: 15 14 20 14   Temp:      TempSrc:      SpO2: 96% 96% 97% 96%  PainSc:        Isolation Precautions No active isolations  Medications Medications  magnesium sulfate IVPB 2 g 50 mL (2 g Intravenous New Bag/Given 06/27/17 2218)    Mobility walks

## 2017-06-27 NOTE — H&P (Deleted)
Amanda Graves 06/27/2017, 10:09 PM  PCP:   Patient, [name]   Chief Complaint:    HPI: Tthis is a 57 y/o female who presents with c/o  Review of Systems:  The patient denies anorexia, fever, weight loss,, vision disturbance, decreased hearing, hoarseness, chest pain, presyncope, dyspnea on exertion, peripheral edema, balance deficits, hemoptysis, abdominal pain, melena, hematochezia, severe indigestion/heartburn, hematuria, incontinence, genital sores, muscle weakness, suspicious skin lesions, transient blindness, difficulty walking, depression, unusual weight change, abnormal bleeding, enlarged lymph nodes, angioedema, and breast masses.  Past Medical History: Past Medical History:  Diagnosis Date  . Gestational diabetes   . Miscarriage   . Preeclampsia    Past Surgical History:  Procedure Laterality Date  . BLADDER REPAIR W/ CESAREAN SECTION    . DILATION AND CURETTAGE OF UTERUS      Medications: Prior to Admission medications   Medication Sig Start Date End Date Taking? Authorizing Provider  Chlorphen-Phenyleph-ASA (ALKA-SELTZER PLUS COLD) 2-7.8-325 MG TBEF Take 1-2 tablets by mouth 2 (two) times daily as needed (cold symtptoms).   Yes [provider]  CYANOCOBALAMIN PO Take 1 tablet by mouth daily.   Yes [provider]  Multiple Vitamin (MULTIVITAMIN WITH MINERALS) TABS tablet Take 1 tablet by mouth daily.   Yes [provider]    Allergies:   Allergies  Allergen Reactions  . Hydrocodone-Acetaminophen Other (See Comments)    Becomes antsy and unsettled     Social History:  reports that  has never smoked. she has never used smokeless tobacco. She reports that she drinks alcohol. She reports that she does not use drugs.  Family History: History reviewed. No pertinent family history.  Physical Exam: Vitals:   06/27/17 2000 06/27/17 2030 06/27/17 2100 06/27/17 2130  BP: 138/75 132/73 130/78 132/78  Pulse: 81 81 90 85  Resp: 17 15 14  20   Temp:      TempSrc:      SpO2: 96% 96% 96% 97%    General:  Alert and oriented times three, well developed and nourished, no acute distress Eyes: PERRLA, pink conjunctiva, no scleral icterus ENT: Moist oral mucosa, neck supple, no thyromegaly Lungs: clear to ascultation, no wheeze, no crackles, no use of accessory muscles Cardiovascular: regular rate and rhythm, no regurgitation, no gallops, no murmurs. No carotid bruits, no JVD Abdomen: soft, positive BS, non-tender, non-distended, no organomegaly, not an acute abdomen GU: not examined Neuro: CN II - XII grossly intact, sensation intact Musculoskeletal: strength 5/5 all extremities, no clubbing, cyanosis or edema Skin: no rash, no subcutaneous crepitation, no decubitus Psych: appropriate patient   Labs on Admission:  Recent Labs    06/27/17 1745 06/27/17 1812  NA  --  144  K  --  3.7  CL  --  107  GLUCOSE  --  118*  BUN  --  15  CREATININE  --  0.70  MG 2.2  --    No results for input(s): AST, ALT, ALKPHOS, BILITOT, PROT, ALBUMIN in the last 72 hours. No results for input(s): LIPASE, AMYLASE in the last 72 hours. Recent Labs    06/27/17 1724 06/27/17 1812  WBC 9.7  --   HGB 14.5 15.6*  HCT 42.7 46.0  MCV 85.9  --   PLT 244  --    No results for input(s): CKTOTAL, CKMB, CKMBINDEX, TROPONINI in the last 72 hours. Invalid input(s): POCBNP No results for input(s): DDIMER in the last 72 hours. No results for input(s): HGBA1C in the last  72 hours. No results for input(s): CHOL, HDL, LDLCALC, TRIG, CHOLHDL, LDLDIRECT in the last 72 hours. No results for input(s): TSH, T4TOTAL, T3FREE, THYROIDAB in the last 72 hours.  Invalid input(s): FREET3 No results for input(s): VITAMINB12, FOLATE, FERRITIN, TIBC, IRON, RETICCTPCT in the last 72 hours.  Micro Results: No results found for this or any previous visit (from the past 240 hour(s)).   Radiological Exams on Admission: Dg Chest Port 1 View  Result Date:  06/27/2017 CLINICAL DATA:  Syncope episode today; no known cardiopulmonary problems; non smoker EXAM: PORTABLE CHEST 1 VIEW COMPARISON:  None. FINDINGS: The heart size and mediastinal contours are within normal limits. Both lungs are clear. The visualized skeletal structures are unremarkable. IMPRESSION: No active disease. Electronically Signed   By: Norva PavlovElizabeth  Brown M.D.   On: 06/27/2017 18:04    Assessment/Plan Present on Admission: . Tachycardia -bring in for overnight observation on medtele -cardiology consult -2D echo -cycle cardiac enzymes -magnesium repletion per cardiology fellow recommendation  Presyncope -see above  Vision changes -see above  [name] 06/27/2017, 10:09 PM

## 2017-06-27 NOTE — H&P (Signed)
PCP:   Patient, [name]   Chief Complaint:  Presyncope  HPI: This is a 57 year old female who was at home today doing some housecleaning when she had a sudden onset of spots in her visual field.  When she checked her blood pressure was 140/98 but her heart read as 162.  She sat for moment and rechecked her vitals, her blood pressure was lower but on subsequent rechecks her heart rate fluctuated between normal and 130-140.  She states when her heart rate went up she had recurrent symptoms of lightheadedness, visual field defects and presyncope.  She denies any chest pains, shortness of breath or diaphoresis.  She denies any other symptomatology.  She called a friend and they came to the ER.  In the ER she had a EKG done that shows 2 beats of a ventricular abnormality, that has not been reproduced on telemetry.  Since being on telemetry she has had no further symptoms.  Hospitalist have been asked to admit for overnight observation on med telemetry.   Review of Systems:  The patient denies anorexia, fever, weight loss,, vision field defect, decreased hearing, hoarseness, chest pain, presyncope, dyspnea on exertion, peripheral edema, balance deficits, hemoptysis, abdominal pain, melena, hematochezia, severe indigestion/heartburn, hematuria, incontinence, genital sores, muscle weakness, suspicious skin lesions, transient blindness, difficulty walking, depression, unusual weight change, abnormal bleeding, enlarged lymph nodes, angioedema, and breast masses.  Past Medical History: Past Medical History:  Diagnosis Date  . Gestational diabetes   . Miscarriage   . Preeclampsia    Past Surgical History:  Procedure Laterality Date  . BLADDER REPAIR W/ CESAREAN SECTION    . DILATION AND CURETTAGE OF UTERUS      Medications: Prior to Admission medications   Medication Sig Start Date End Date Taking? Authorizing Provider  Chlorphen-Phenyleph-ASA (ALKA-SELTZER PLUS COLD) 2-7.8-325 MG TBEF Take 1-2  tablets by mouth 2 (two) times daily as needed (cold symtptoms).   Yes [provider]  CYANOCOBALAMIN PO Take 1 tablet by mouth daily.   Yes [provider]  Multiple Vitamin (MULTIVITAMIN WITH MINERALS) TABS tablet Take 1 tablet by mouth daily.   Yes [provider]    Allergies:   Allergies  Allergen Reactions  . Hydrocodone-Acetaminophen Other (See Comments)    Becomes antsy and unsettled     Social History:  reports that  has never smoked. she has never used smokeless tobacco. She reports that she drinks alcohol. She reports that she does not use drugs.  Family History: Diabetes mellitus type 2 .  Physical Exam: Vitals:   06/27/17 2030 06/27/17 2100 06/27/17 2130 06/27/17 2200  BP: 132/73 130/78 132/78 (!) 145/77  Pulse: 81 90 85 94  Resp: 15 14 20 14   Temp:      TempSrc:      SpO2: 96% 96% 97% 96%    General:  Alert and oriented times three, well developed and nourished, no acute distress Eyes: PERRLA, pink conjunctiva, no scleral icterus ENT: Moist oral mucosa, neck supple, no thyromegaly Lungs: clear to ascultation, no wheeze, no crackles, no use of accessory muscles Cardiovascular: regular rate and rhythm, no regurgitation, no gallops, no murmurs. No carotid bruits, no JVD Abdomen: soft, positive BS, non-tender, non-distended, no organomegaly, not an acute abdomen GU: not examined Neuro: CN II - XII grossly intact, sensation intact Musculoskeletal: strength 5/5 all extremities, no clubbing, cyanosis or edema Skin: no rash, no subcutaneous crepitation, no decubitus Psych: appropriate patient   Labs on Admission:  Recent Labs  06/27/17 1745 06/27/17 1812  NA  --  144  K  --  3.7  CL  --  107  GLUCOSE  --  118*  BUN  --  15  CREATININE  --  0.70  MG 2.2  --    No results for input(s): AST, ALT, ALKPHOS, BILITOT, PROT, ALBUMIN in the last 72 hours. No results for input(s): LIPASE, AMYLASE in the last 72 hours. Recent Labs     06/27/17 1724 06/27/17 1812  WBC 9.7  --   HGB 14.5 15.6*  HCT 42.7 46.0  MCV 85.9  --   PLT 244  --    No results for input(s): CKTOTAL, CKMB, CKMBINDEX, TROPONINI in the last 72 hours. Invalid input(s): POCBNP No results for input(s): DDIMER in the last 72 hours. No results for input(s): HGBA1C in the last 72 hours. No results for input(s): CHOL, HDL, LDLCALC, TRIG, CHOLHDL, LDLDIRECT in the last 72 hours. No results for input(s): TSH, T4TOTAL, T3FREE, THYROIDAB in the last 72 hours.  Invalid input(s): FREET3 No results for input(s): VITAMINB12, FOLATE, FERRITIN, TIBC, IRON, RETICCTPCT in the last 72 hours.  Micro Results: No results found for this or any previous visit (from the past 240 hour(s)).   Radiological Exams on Admission: Dg Chest Port 1 View  Result Date: 06/27/2017 CLINICAL DATA:  Syncope episode today; no known cardiopulmonary problems; non smoker EXAM: PORTABLE CHEST 1 VIEW COMPARISON:  None. FINDINGS: The heart size and mediastinal contours are within normal limits. Both lungs are clear. The visualized skeletal structures are unremarkable. IMPRESSION: No active disease. Electronically Signed   By: Norva PavlovElizabeth  Brown M.D.   On: 06/27/2017 18:04    Assessment/Plan Present on Admission: . Tachycardia -Bring in for overnight observation on med telemetry -Consult cardiology -2D echo in a.m. -Cycle cardiac enzymes, TSH in a.m,  -Magnesium repletion per cardiology further recommendation  Presyncope -See above  Vision changes -See above  Amanda Graves 06/27/2017, 10:54 PM

## 2017-06-28 ENCOUNTER — Observation Stay (HOSPITAL_BASED_OUTPATIENT_CLINIC_OR_DEPARTMENT_OTHER): Payer: BLUE CROSS/BLUE SHIELD

## 2017-06-28 DIAGNOSIS — R9431 Abnormal electrocardiogram [ECG] [EKG]: Secondary | ICD-10-CM | POA: Diagnosis not present

## 2017-06-28 DIAGNOSIS — R55 Syncope and collapse: Secondary | ICD-10-CM

## 2017-06-28 DIAGNOSIS — R42 Dizziness and giddiness: Secondary | ICD-10-CM

## 2017-06-28 LAB — CREATININE, SERUM
Creatinine, Ser: 0.76 mg/dL (ref 0.44–1.00)
GFR calc non Af Amer: >60 mL/min (ref >60)

## 2017-06-28 LAB — TROPONIN I

## 2017-06-28 LAB — TSH: TSH: 3.25 u[IU]/mL (ref 0.350–4.500)

## 2017-06-28 LAB — CBC
HEMATOCRIT: 42.9 % (ref 36.0–46.0)
Hemoglobin: 14.3 g/dL (ref 12.0–15.0)
MCH: 29.1 pg (ref 26.0–34.0)
MCHC: 33.3 g/dL (ref 30.0–36.0)
MCV: 87.4 fL (ref 78.0–100.0)
Platelets: 230 10*3/uL (ref 150–400)
RBC: 4.91 MIL/uL (ref 3.87–5.11)
RDW: 12.9 % (ref 11.5–15.5)
WBC: 8.4 10*3/uL (ref 4.0–10.5)

## 2017-06-28 LAB — MAGNESIUM: MAGNESIUM: 2.2 mg/dL (ref 1.7–2.4)

## 2017-06-28 LAB — BASIC METABOLIC PANEL
ANION GAP: 6 (ref 5–15)
BUN: 17 mg/dL (ref 6–20)
CALCIUM: 8.8 mg/dL — AB (ref 8.9–10.3)
CHLORIDE: 108 mmol/L (ref 101–111)
CO2: 25 mmol/L (ref 22–32)
CREATININE: 0.72 mg/dL (ref 0.44–1.00)
GFR calc non Af Amer: >60 mL/min (ref >60)
Glucose, Bld: 96 mg/dL (ref 65–99)
Potassium: 3.9 mmol/L (ref 3.5–5.1)
Sodium: 139 mmol/L (ref 135–145)

## 2017-06-28 LAB — ECHOCARDIOGRAM COMPLETE
Height: 65 in
Weight: 4504.44 oz

## 2017-06-28 LAB — HIV ANTIBODY (ROUTINE TESTING W REFLEX): HIV Screen 4th Generation wRfx: NONREACTIVE

## 2017-06-28 MED ORDER — POLYETHYLENE GLYCOL 3350 17 G PO PACK: 17.0000 g | PACK | Freq: Every day | ORAL | 0 refills | Status: DC | PRN

## 2017-06-28 MED ORDER — METOPROLOL TARTRATE 25 MG PO TABS
12.5000 mg | ORAL_TABLET | Freq: Two times a day (BID) | ORAL | Status: DC
  Administered 2017-06-28: 12.5 mg via ORAL
  Filled 2017-06-28: qty 1

## 2017-06-28 MED ORDER — METOPROLOL TARTRATE 25 MG PO TABS: 12.5000 mg | ORAL_TABLET | Freq: Two times a day (BID) | ORAL | 2 refills | Status: DC

## 2017-06-28 MED ORDER — DIPHENHYDRAMINE HCL 25 MG PO CAPS
25.0000 mg | ORAL_CAPSULE | Freq: Every evening | ORAL | Status: DC | PRN
  Administered 2017-06-28: 25 mg via ORAL
  Filled 2017-06-28: qty 1

## 2017-06-28 NOTE — Progress Notes (Signed)
  Echocardiogram 2D Echocardiogram has been performed.  Roosvelt MaserLane, Loray Akard F 06/28/2017, 11:09 AM

## 2017-06-28 NOTE — Discharge Summary (Signed)
Physician Discharge Summary  Amanda Graves ZOX:[id number] DOB: [birth year] DOA: 06/27/2017  PCP: Patient, [name]  Admit date: 06/27/2017 Discharge date: 06/28/2017  Admitted From: Home Disposition: Home Recommendations for Outpatient Follow-up:  1. Follow up with PCP in 1-2 weeks 2. Please obtain BMP/CBC in one week  Home Health none Equipment/Devices none  Discharge Condition: Stable CODE STATUS full code Diet recommendation cardiac  Brief/Interim Summary:57 year old female who was at home today doing some housecleaning when she had a sudden onset of spots in her visual field.  When she checked her blood pressure was 140/98 but her heart read as 162.  She sat for moment and rechecked her vitals, her blood pressure was lower but on subsequent rechecks her heart rate fluctuated between normal and 130-140.  She states when her heart rate went up she had recurrent symptoms of lightheadedness, visual field defects and presyncope.  She denies any chest pains, shortness of breath or diaphoresis.  She denies any other symptomatology.  She called a friend and they came to the ER.  In the ER she had a EKG done that shows 2 beats of a ventricular abnormality, that has not been reproduced on telemetry.  Since being on telemetry she has had no further symptoms.  Hospitalist have been asked to admit for overnight observation on med telemetry.  06/28/2017 patient reports feeling fine and better and wants to go home.  She reported that she has been using a lot of kind decongestants in the last 5 days prior to admission to the hospital for sinus problems.  In addition to that she drinks at 20 ounce coffee from [employer].  And she has lost her husband 2 years ago and she was cleaning out the cabin of all his medications while this episode of tachycardia happen.  Discharge Diagnoses:  Principal Problem:   Postural dizziness with presyncope Active Problems:   Tachycardia  1]nsvt-appreciate cardiology  consult.  Final echo report is pending at this time however cardiologist has looked at the echo and reported no structural damage.  Cardiac enzymes are normal CBC BMP normal.  Patient will be discharged today to follow-up with cardiology and have a stress test as an outpatient.  Meanwhile she will get her first dose of metoprolol prior to discharge.  This was discussed in detail with the patient.  Discharge Instructions   Allergies as of 06/28/2017      Reactions   Hydrocodone-acetaminophen Other (See Comments)   Becomes antsy and unsettled       Medication List    STOP taking these medications   ALKA-SELTZER PLUS COLD 2-7.8-325 MG Tbef Generic drug:  Chlorphen-Phenyleph-ASA     TAKE these medications   CYANOCOBALAMIN PO Take 1 tablet by mouth daily.   metoprolol tartrate 25 MG tablet Commonly known as:  LOPRESSOR Take 0.5 tablets (12.5 mg total) by mouth 2 (two) times daily.   multivitamin with minerals Tabs tablet Take 1 tablet by mouth daily.   polyethylene glycol packet Commonly known as:  MIRALAX / GLYCOLAX Take 17 g by mouth daily as needed for mild constipation.      Follow-up Information    Patwardhan, Anabel Bene, MD Follow up.   Specialty:  Cardiology Contact information: [address] [place] [address] [place] [address] [phone]          Allergies  Allergen Reactions  . Hydrocodone-Acetaminophen Other (See Comments)    Becomes antsy and unsettled     Consultations:  Dr Rosemary Holms  Procedures/Studies: Dg Chest Port 1 View  Result Date: 06/27/2017 CLINICAL DATA:  Syncope episode today; no known cardiopulmonary problems; non smoker EXAM: PORTABLE CHEST 1 VIEW COMPARISON:  None. FINDINGS: The heart size and mediastinal contours are within normal limits. Both lungs are clear. The visualized skeletal structures are unremarkable. IMPRESSION: No active disease. Electronically Signed   By: Norva Pavlov M.D.   On: 06/27/2017 18:04    (Echo, Carotid,  EGD, Colonoscopy, ERCP)    Subjective:   Discharge Exam: Vitals:   06/27/17 2320 06/28/17 0530  BP: 124/63 113/60  Pulse: 86 72  Resp: 18 18  Temp: 98.3 F (36.8 C) 97.7 F (36.5 C)  SpO2: 97% 96%   Vitals:   06/27/17 2130 06/27/17 2200 06/27/17 2320 06/28/17 0530  BP: 132/78 (!) 145/77 124/63 113/60  Pulse: 85 94 86 72  Resp: 20 14 18 18   Temp:   98.3 F (36.8 C) 97.7 F (36.5 C)  TempSrc:   Oral Oral  SpO2: 97% 96% 97% 96%  Weight:    127.7 kg (281 lb 8.4 oz)  Height:    5\' 5"  (1.651 m)    General: Pt is alert, awake, not in acute distress Cardiovascular: RRR, S1/S2 +, no rubs, no gallops Respiratory: CTA bilaterally, no wheezing, no rhonchi Abdominal: Soft, NT, ND, bowel sounds + Extremities: no edema, no cyanosis    The results of significant diagnostics from this hospitalization (including imaging, microbiology, ancillary and laboratory) are listed below for reference.     Microbiology: No results found for this or any previous visit (from the past 240 hour(s)).   Labs: BNP (last 3 results) No results for input(s): BNP in the last 8760 hours. Basic Metabolic Panel: Recent Labs  Lab 06/27/17 1745 06/27/17 1812 06/27/17 2340 06/28/17 0547  NA  --  144  --  139  K  --  3.7  --  3.9  CL  --  107  --  108  CO2  --   --   --  25  GLUCOSE  --  118*  --  96  BUN  --  15  --  17  CREATININE  --  0.70 0.76 0.72  CALCIUM  --   --   --  8.8*  MG 2.2  --   --  2.2   Liver Function Tests: No results for input(s): AST, ALT, ALKPHOS, BILITOT, PROT, ALBUMIN in the last 168 hours. No results for input(s): LIPASE, AMYLASE in the last 168 hours. No results for input(s): AMMONIA in the last 168 hours. CBC: Recent Labs  Lab 06/27/17 1724 06/27/17 1812 06/27/17 2340  WBC 9.7  --  8.4  HGB 14.5 15.6* 14.3  HCT 42.7 46.0 42.9  MCV 85.9  --  87.4  PLT 244  --  230   Cardiac Enzymes: Recent Labs  Lab 06/27/17 2340 06/28/17 0547  TROPONINI <0.03 <0.03    BNP: Invalid input(s): POCBNP CBG: No results for input(s): GLUCAP in the last 168 hours. D-Dimer No results for input(s): DDIMER in the last 72 hours. Hgb A1c No results for input(s): HGBA1C in the last 72 hours. Lipid Profile No results for input(s): CHOL, HDL, LDLCALC, TRIG, CHOLHDL, LDLDIRECT in the last 72 hours. Thyroid function studies Recent Labs    06/28/17 0547  TSH 3.250   Anemia work up No results for input(s): VITAMINB12, FOLATE, FERRITIN, TIBC, IRON, RETICCTPCT in the last 72 hours. Urinalysis No results found for: COLORURINE, APPEARANCEUR, LABSPEC, PHURINE, GLUCOSEU, HGBUR,  BILIRUBINUR, KETONESUR, PROTEINUR, UROBILINOGEN, NITRITE, LEUKOCYTESUR Sepsis Labs Invalid input(s): PROCALCITONIN,  WBC,  LACTICIDVEN Microbiology No results found for this or any previous visit (from the past 240 hour(s)).   Time coordinating discharge: Over 30 minutes  SIGNED:   Alwyn RenElizabeth G Mathews, MD  [facility] 06/28/2017, 1:27 PM Pager   If 7PM-7AM, please contact night-coverage [email or web address] Password [email or web address]

## 2017-06-28 NOTE — Progress Notes (Signed)
Patient discharged home as ordered. Discharge paperwork explained and sent with pt who verbalized understanding. Pt aware of where and which to prescriptions to pick up from pharmacy. Pt tolerated first dose of PO metoprolol with no complications.

## 2017-06-28 NOTE — Consult Note (Addendum)
Reason for Consult: Nonsustained ventricular tachycardia Referring Physician: Quintella Baton, MD  Amanda Graves is an 57 y.o. female.  HPI:   57 y/o Caucasian female with h/o preeclampsia, gestational diabetes, obesity.   Patient was cleaning out her closet on 06/27/2017 afternoon, when she had intermittent episodes of palpitation and lightheadedness along with blurry vision. She had presyncopal episodes without any syncope. She reports heart rate being in 160s at times. She denies any chest pain, shortness of breath. She reported episodes on route to the hospital and in the waiting room, but subsided since she's been in the hospital.  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 She had been physically active until a month ago, physical activity slowed down due to work in the last month. [facility]. She reports that she has gained some weight in the last year or so after losing her husband.  She is a lifetime nonsmoker, although had passive exposure through her parents in childhood. Her mother had long-standing history of smoking and heart failure. Otherwise, no family history of any cardiac illnesses. No history of sudden cardiac death. A lot of family members on her father's side lived into their 42s.  Past Medical History:  Diagnosis Date  . Gestational diabetes   . Miscarriage   . Preeclampsia     Past Surgical History:  Procedure Laterality Date  . BLADDER REPAIR W/ CESAREAN SECTION    . DILATION AND CURETTAGE OF UTERUS      History reviewed. No pertinent family history.  Social History:  reports that  has never smoked. she has never used smokeless tobacco. She reports that she drinks alcohol. She reports that she does not use drugs.  Allergies:  Allergies  Allergen Reactions  . Hydrocodone-Acetaminophen Other (See Comments)    Becomes antsy and unsettled      Medications: I have reviewed the patient's current medications.  Results for orders placed or performed during the hospital encounter of 06/27/17 (from the past 48 hour(s))  CBC     Status: None   Collection Time: 06/27/17  5:24 PM  Result Value Ref Range   WBC 9.7 4.0 - 10.5 K/uL   RBC 4.97 3.87 - 5.11 MIL/uL   Hemoglobin 14.5 12.0 - 15.0 g/dL   HCT 42.7 36.0 - 46.0 %   MCV 85.9 78.0 - 100.0 fL   MCH 29.2 26.0 - 34.0 pg   MCHC 34.0 30.0 - 36.0 g/dL   RDW 13.0 11.5 - 15.5 %   Platelets 244 150 - 400 K/uL    Comment: Performed at [facility], [place] [address], [place], [place] [address]  Magnesium     Status: None   Collection Time: 06/27/17  5:45 PM  Result Value Ref Range   Magnesium 2.2 1.7 - 2.4 mg/dL    Comment: Performed at [facility], [place] [address] [place], [place] [address]  I-Stat beta hCG blood, ED     Status: None   Collection Time: 06/27/17  6:09 PM  Result Value Ref Range   I-stat hCG, quantitative <5.0 <5 mIU/mL   Comment 3            Comment:   GEST. AGE      CONC.  (mIU/mL)   <=1 WEEK        5 - 50     2 WEEKS  50 - 500     3 WEEKS       100 - 10,000     4 WEEKS     1,000 - 30,000        FEMALE AND NON-PREGNANT FEMALE:     LESS THAN 5 mIU/mL   I-stat troponin, ED     Status: None   Collection Time: 06/27/17  6:10 PM  Result Value Ref Range   Troponin i, poc 0.01 0.00 - 0.08 ng/mL   Comment 3            Comment: Due to the release kinetics of cTnI, a negative result within the first hours of the onset of symptoms does not rule out myocardial infarction with certainty. If myocardial infarction is still suspected, repeat the test at appropriate intervals.   I-stat Chem 8, ED     Status: Abnormal   Collection Time: 06/27/17  6:12 PM  Result Value Ref Range   Sodium 144 135 - 145 mmol/L   Potassium 3.7 3.5 - 5.1 mmol/L   Chloride 107 101 - 111 mmol/L   BUN 15 6 - 20 mg/dL   Creatinine, Ser 0.70 0.44 -  1.00 mg/dL   Glucose, Bld 118 (H) 65 - 99 mg/dL   Calcium, Ion 1.15 1.15 - 1.40 mmol/L   TCO2 24 22 - 32 mmol/L   Hemoglobin 15.6 (H) 12.0 - 15.0 g/dL   HCT 46.0 36.0 - 46.0 %  CBC     Status: None   Collection Time: 06/27/17 11:40 PM  Result Value Ref Range   WBC 8.4 4.0 - 10.5 K/uL   RBC 4.91 3.87 - 5.11 MIL/uL   Hemoglobin 14.3 12.0 - 15.0 g/dL   HCT 42.9 36.0 - 46.0 %   MCV 87.4 78.0 - 100.0 fL   MCH 29.1 26.0 - 34.0 pg   MCHC 33.3 30.0 - 36.0 g/dL   RDW 12.9 11.5 - 15.5 %   Platelets 230 150 - 400 K/uL    Comment: Performed at [facility], [place] [address] [place], [place] [address]  Creatinine, serum     Status: None   Collection Time: 06/27/17 11:40 PM  Result Value Ref Range   Creatinine, Ser 0.76 0.44 - 1.00 mg/dL   GFR calc non Af Amer >60 >60 mL/min   GFR calc Af Amer >60 >60 mL/min    Comment: (NOTE) The eGFR has been calculated using the CKD EPI equation. This calculation has not been validated in all clinical situations. eGFR's persistently <60 mL/min signify possible Chronic Kidney Disease. Performed at [facility], [place] [address], [place], [place] [address]   Troponin I     Status: None   Collection Time: 06/27/17 11:40 PM  Result Value Ref Range   Troponin I <0.03 <0.03 ng/mL    Comment: Performed at [facility], [place] [address] [place], [place] [address]  Magnesium     Status: None   Collection Time: 06/28/17  5:47 AM  Result Value Ref Range   Magnesium 2.2 1.7 - 2.4 mg/dL    Comment: Performed at [facility], [place] [address], [place], [place] [address]  TSH     Status: None   Collection Time: 06/28/17  5:47 AM  Result Value Ref Range   TSH 3.250 0.350 - 4.500 uIU/mL    Comment: Performed by a 3rd Generation assay with a functional sensitivity of <=0.01 uIU/mL. Performed at [facility], [address] [place], [place] [address]   Basic metabolic panel      Status: Abnormal   Collection Time: 06/28/17  5:47 AM  Result Value Ref Range   Sodium 139 135 - 145 mmol/L   Potassium 3.9 3.5 - 5.1 mmol/L   Chloride 108 101 - 111 mmol/L   CO2 25 22 - 32 mmol/L   Glucose, Bld 96 65 - 99 mg/dL   BUN 17 6 - 20 mg/dL   Creatinine, Ser 0.72 0.44 - 1.00 mg/dL   Calcium 8.8 (L) 8.9 - 10.3 mg/dL   GFR calc non Af Amer >60 >60 mL/min   GFR calc Af Amer >60 >60 mL/min    Comment: (NOTE) The eGFR has been calculated using the CKD EPI equation. This calculation has not been validated in all clinical situations. eGFR's persistently <60 mL/min signify possible Chronic Kidney Disease.    Anion gap 6 5 - 15    Comment: Performed at [facility], [place] [address] [place], [place] [address]  Troponin I     Status: None   Collection Time: 06/28/17  5:47 AM  Result Value Ref Range   Troponin I <0.03 <0.03 ng/mL    Comment: Performed at [facility], [place] [address], [place],  [address]    Dg Chest Port 1 View  Result Date: 06/27/2017 CLINICAL DATA:  Syncope episode today; no known cardiopulmonary problems; non smoker EXAM: PORTABLE CHEST 1 VIEW COMPARISON:  None. FINDINGS: The heart size and mediastinal contours are within normal limits. Both lungs are clear. The visualized skeletal structures are unremarkable. IMPRESSION: No active disease. Electronically Signed   By: Nolon Nations M.D.   On: 06/27/2017 18:04    Review of Systems  Constitutional: Negative for chills, fever and malaise/fatigue.  HENT: Negative.   Respiratory: Negative for cough, hemoptysis, sputum production and shortness of breath.   Cardiovascular: Positive for palpitations. Negative for chest pain, orthopnea, claudication, leg swelling and PND.  Gastrointestinal: Negative for abdominal pain and vomiting.  Genitourinary: Negative.   Musculoskeletal: Negative.   Skin: Negative for rash.  Neurological: Positive for dizziness. Negative for loss  of consciousness.  Endo/Heme/Allergies: Does not bruise/bleed easily.  Psychiatric/Behavioral: The patient is nervous/anxious.   All other systems reviewed and are negative.  Blood pressure 113/60, pulse 72, temperature 97.7 F (36.5 C), temperature source Oral, resp. rate 18, height _0  (1.651 m), weight 127.7 kg (281 lb 8.4 oz), SpO2 96 %. Physical Exam  Nursing note and vitals reviewed. Constitutional: She is oriented to person, place, and time. She appears well-developed and well-nourished.  Obese  HENT:  Head: Normocephalic and atraumatic.  Eyes: Conjunctivae are normal. Pupils are equal, round, and reactive to light.  Neck: Normal range of motion. Neck supple. No JVD present.  Cardiovascular: Normal rate, regular rhythm and normal heart sounds. Exam reveals no gallop.  No murmur heard. Respiratory: Effort normal and breath sounds normal. She has no wheezes. She has no rales.  Musculoskeletal: Normal range of motion. She exhibits edema.  Neurological: She is alert and oriented to person, place, and time. No cranial nerve deficit.  Skin: Skin is warm and dry.  Psychiatric: She has a normal mood and affect.      EKG 06/27/2017: Sinus tachycardia 102 bpm. Right axis deviation. Low voltage precordial leads 4 beat nonsustained ventricular tachycardia  Echocardiogram 06/28/2017: Study Conclusions  - Left ventricle: The cavity size was normal. Wall thickness was   increased in a pattern  of mild LVH. Systolic function was normal.   The estimated ejection fraction was in the range of 60% to 65%.   Doppler parameters are consistent with abnormal left ventricular   relaxation (grade 1 diastolic dysfunction). - Atrial septum: No defect or patent foramen ovale was identified.   Assessment: 57 y/o Caucasian female with h/o preeclampsia, gestational diabetes, obesity, admitted with episodes of nonsustained ventricular tachycardia  NSVT: One episode of 4 beat NSVT on EKG. No  recurrence while on the floor. Structurally normal heart on echocardiogram. Negative cardiac biomarkers.  Recommendations: Given normal physical exam, normal echocardiogram, I do not see any reason for further hospitalization at this time. Start metoprolol 12.5 mg twice daily. Will arrange for outpatient exercise treadmill stress test to rule out ischemia as etiology of her episodes. Other differential includes idiopathic or outflow tract VT. I have urged patient to exercise of most caution while driving. Fortunately, she has not had any sustained ventricular tachycardia or syncope episode. I have urged her to pull over the curb side at the slightest hint of recurrence of her symptoms.  Jadie Allington J Brin Ruggerio 06/28/2017, 10:41 AM   [place], MD [facility] [place] Pager: [phone] Office: [phone] If no answer Cell [phone]

## 2018-06-08 IMAGING — DX DG CHEST 1V PORT
1 series · 1 of 1 positions shown · non-contrast
Comparison: None.

CLINICAL DATA: Syncope episode today; no known cardiopulmonary
problems; non smoker

EXAM:
PORTABLE CHEST 1 VIEW

[chest ap]
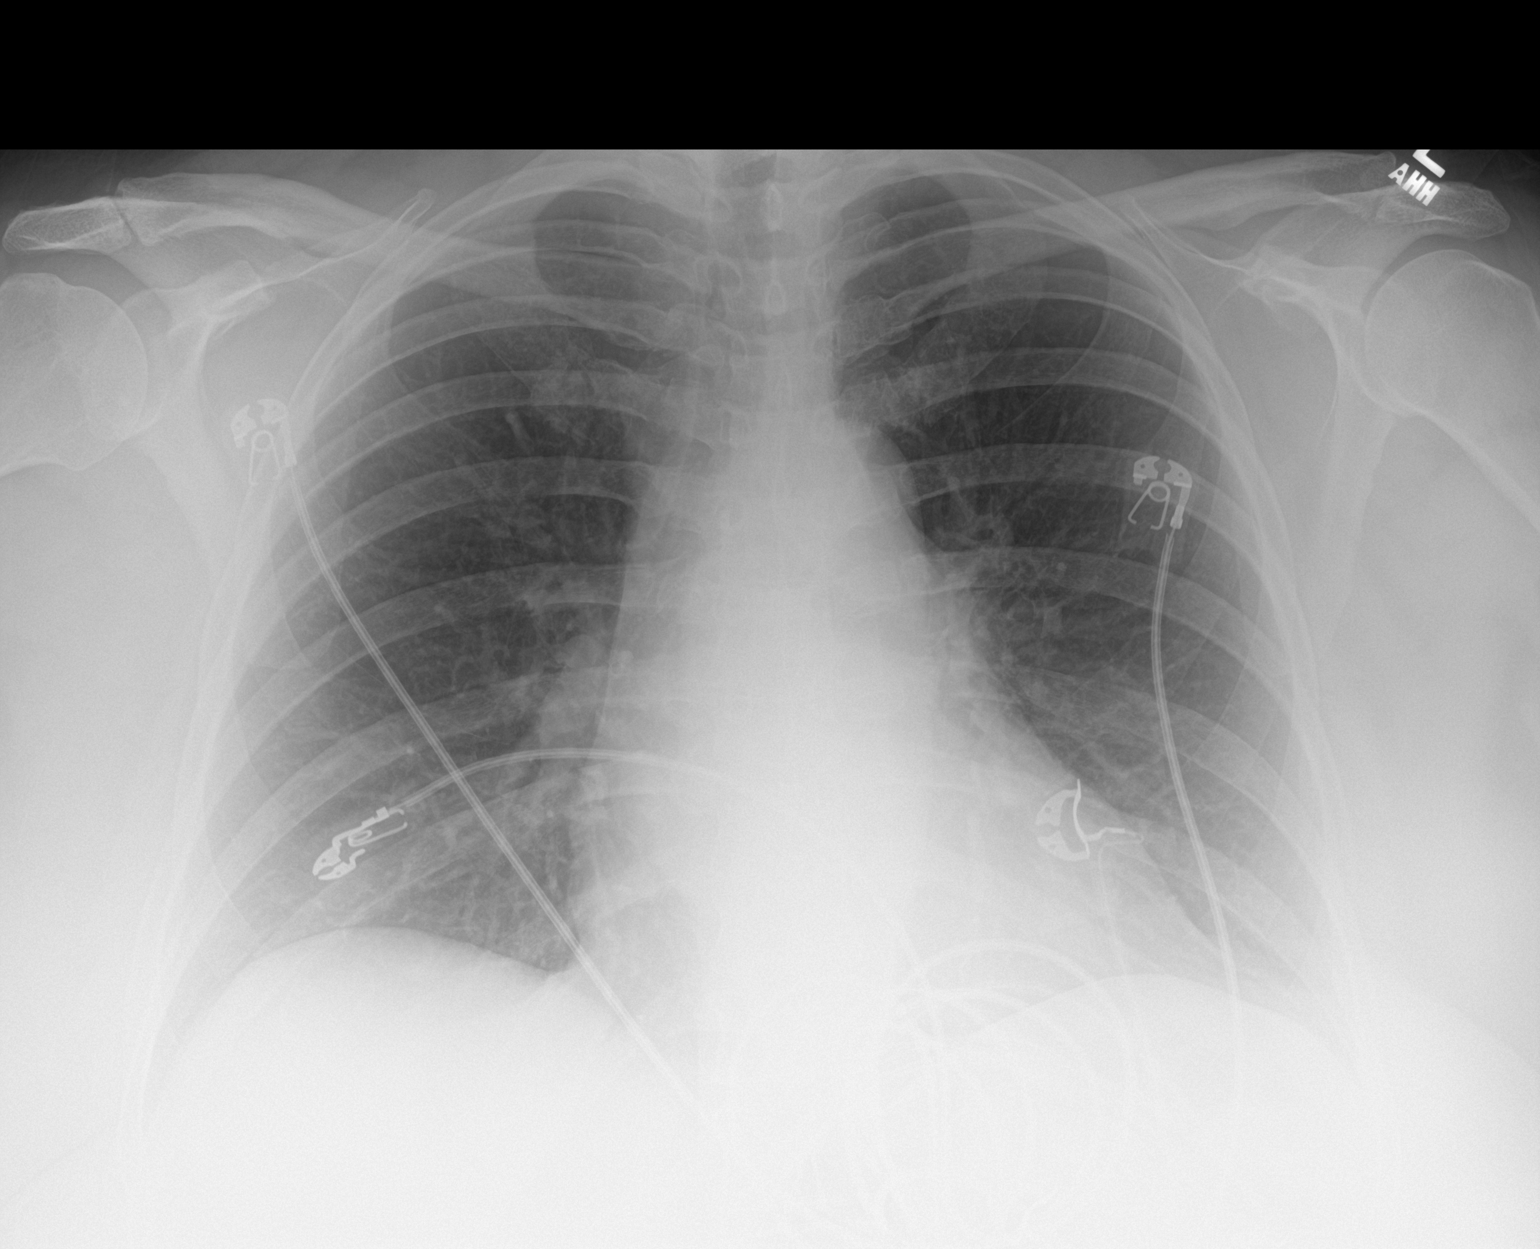

[1 of 1 positions shown; findings below may reference images not displayed]

FINDINGS: The heart size and mediastinal contours are within normal limits.
Both lungs are clear. The visualized skeletal structures are
unremarkable.
IMPRESSION: No active disease.

## 2018-07-19 ENCOUNTER — Telehealth: Payer: Self-pay | Admitting: Cardiology

## 2018-07-19 NOTE — Telephone Encounter (Signed)
-----   Message from Beryle Quant, CMA sent at 07/19/2018  2:59 PM EST ----- Regarding: patient call Pt called today and c/o having a chest cold since Wednesday and she has been taking mucinex; She has been having some dizziness, but NO cp and NO sob; She took her bp today and it was 149/110 and it has gone down since this morning the dizziness has eased up; I guess she wants to know what should she do? Please advise

## 2018-07-19 NOTE — Telephone Encounter (Signed)
58 y/o Caucasian female with h/o NSVT in 06/2017 with structurally normal heart on echocardiogram, low risk stress test on 06/2017. I was notified about this call by the MA at 4:55 PM. Unfortunately, patient could not be seen today for an acute visit. If she has presyncope/syncope, recommend ED evaluation. If not, will try to arrange outpatient visit in the coming week.

## 2018-07-20 ENCOUNTER — Telehealth: Payer: Self-pay | Admitting: Radiology

## 2018-07-22 ENCOUNTER — Ambulatory Visit: Payer: BLUE CROSS/BLUE SHIELD

## 2018-07-22 ENCOUNTER — Encounter: Payer: Self-pay | Admitting: Cardiology

## 2018-07-22 ENCOUNTER — Ambulatory Visit: Payer: BLUE CROSS/BLUE SHIELD | Admitting: Cardiology

## 2018-07-22 ENCOUNTER — Telehealth: Payer: Self-pay

## 2018-07-22 VITALS — BP 137/83 | HR 74 | Ht 65.0 in | Wt 264.0 lb

## 2018-07-22 DIAGNOSIS — R002 Palpitations: Secondary | ICD-10-CM

## 2018-07-22 DIAGNOSIS — R42 Dizziness and giddiness: Secondary | ICD-10-CM

## 2018-07-22 DIAGNOSIS — I4729 Other ventricular tachycardia: Secondary | ICD-10-CM

## 2018-07-22 DIAGNOSIS — I472 Ventricular tachycardia: Secondary | ICD-10-CM

## 2018-07-22 NOTE — Progress Notes (Signed)
Patient is here for follow up visit.  Subjective:   Amanda Graves, female    DOB: Oct 15, 1960, 58 y.o.   MRN: [id number]   Chief Complaint  Patient presents with  . Dizziness    HPI  58 year old Caucasian female with history of presyncope due to nonsustained ventricular tachycardia 06/2017.  Patient's echocardiogram and nuclear stress test showed structurally normal heart with no ischemia.  Patient had been doing well on diltiazem 120 mg daily.  She was last seen by me on 06/18/2018.  Patient is here today for an acute visit. On 07/19/2018, patient had an episode of sudden lightheadedness episode while working.  Symptoms improved after drinking a glass of water and resting, only to return up an hour later.  Patient checked her heart rate and blood pressure on monitor, and did not see any alert for irregular heart rhythm.  Blood pressure was 138/1 108 mmHg.  She did not lose consciousness.  Her symptoms have resolved since then with no further recurrence.  Patient reports recent URI symptoms for which she was treated with antibiotics.  She reports a lot of stress at work.  Past Medical History:  Diagnosis Date  . Gestational diabetes   . Miscarriage   . Preeclampsia      Past Surgical History:  Procedure Laterality Date  . BLADDER REPAIR W/ CESAREAN SECTION    . DILATION AND CURETTAGE OF UTERUS       Social History   Socioeconomic History  . Marital status: Married    Spouse name: Not on file  . Number of children: Not on file  . Years of education: Not on file  . Highest education level: Not on file  Occupational History  . Not on file  Social Needs  . Financial resource strain: Not on file  . Food insecurity:    Worry: Not on file    Inability: Not on file  . Transportation needs:    Medical: Not on file    Non-medical: Not on file  Tobacco Use  . Smoking status: Never Smoker  . Smokeless tobacco: Never Used  Substance and Sexual Activity  .  Alcohol use: Yes  . Drug use: No  . Sexual activity: Not on file  Lifestyle  . Physical activity:    Days per week: Not on file    Minutes per session: Not on file  . Stress: Not on file  Relationships  . Social connections:    Talks on phone: Not on file    Gets together: Not on file    Attends religious service: Not on file    Active member of club or organization: Not on file    Attends meetings of clubs or organizations: Not on file    Relationship status: Not on file  . Intimate partner violence:    Fear of current or ex partner: Not on file    Emotionally abused: Not on file    Physically abused: Not on file    Forced sexual activity: Not on file  Other Topics Concern  . Not on file  Social History Narrative  . Not on file     Current Outpatient Medications on File Prior to Visit  Medication Sig Dispense Refill  . CYANOCOBALAMIN PO Take 1 tablet by mouth daily.    Marland Kitchen diltiazem (TIAZAC) 120 MG 24 hr capsule Take 120 mg by mouth daily.    Marland Kitchen doxycycline (VIBRAMYCIN) 100 MG capsule Take 1 capsule by mouth daily.    Marland Kitchen  omeprazole (PRILOSEC) 20 MG capsule Take 1 capsule by mouth as needed.    Marland Kitchen. SAXENDA 18 MG/3ML SOPN Inject 3 mLs into the skin daily.    . Vitamin D, Cholecalciferol, 25 MCG (1000 UT) CAPS Take 2 capsules by mouth daily.     No current facility-administered medications on file prior to visit.     Cardiovascular studies:  EKG 07/22/2018: Sinus  Rhythm 63 bpm.  Normal EKG.  Hospital echocardiogram 06/28/2017:  - Left ventricle: The cavity size was normal. Wall thickness was   increased in a pattern of mild LVH. Systolic function was normal.   The estimated ejection fraction was in the range of 60% to 65%.   Doppler parameters are consistent with abnormal left ventricular   relaxation (grade 1 diastolic dysfunction). - Atrial septum: No defect or patent foramen ovale was identified.  Treadmill exercise stress test 06/29/2017: Indication: Syncope The  patient exercised on Bruce protocol for  07:09 min. Patient achieved  8.80 METS and reached HR  197 bpm, which is  118 % of maximum age-predicted HR.  Stress test terminated due to fatigue. Arrhythmias: none.  BP Response to Exercise: Normal resting BP- appropriate response. Chest Pain: none. ST Changes: With peak exercise there was no ST-T changes of ischemia. Exercise capacity was below average for age. HR Response to Exercise: Exaggerated HR response. Continue primary/secondary prevention.  Review of Systems  Constitution: Negative for decreased appetite, malaise/fatigue, weight gain and weight loss.  HENT: Negative for congestion.   Eyes: Negative for visual disturbance.  Cardiovascular: Positive for near-syncope (Resolved) and palpitations (Resolved). Negative for chest pain, dyspnea on exertion, leg swelling and syncope.  Respiratory: Negative for shortness of breath.   Endocrine: Negative for cold intolerance.  Hematologic/Lymphatic: Does not bruise/bleed easily.  Skin: Negative for itching and rash.  Musculoskeletal: Negative for myalgias.  Gastrointestinal: Negative for abdominal pain, nausea and vomiting.  Genitourinary: Negative for dysuria.  Neurological: Positive for light-headedness (Resolved). Negative for weakness.  Psychiatric/Behavioral: The patient is not nervous/anxious.   All other systems reviewed and are negative.      Objective:    Vitals:   07/22/18 1559  BP: 137/83  Pulse: 74  SpO2: 97%    Physical Exam  Constitutional: She is oriented to person, place, and time. She appears well-developed and well-nourished. No distress.  Moderately obese  HENT:  Head: Normocephalic and atraumatic.  Eyes: Pupils are equal, round, and reactive to light. Conjunctivae are normal.  Neck: No JVD present.  Cardiovascular: Normal rate, regular rhythm and intact distal pulses.  No murmur heard. Pulmonary/Chest: Effort normal and breath sounds normal. She has no wheezes. She  has no rales.  Abdominal: Soft. Bowel sounds are normal. There is no rebound.  Musculoskeletal:        General: No edema.  Lymphadenopathy:    She has no cervical adenopathy.  Neurological: She is alert and oriented to person, place, and time. No cranial nerve deficit.  Skin: Skin is warm and dry.  Psychiatric: She has a normal mood and affect.  Nursing note and vitals reviewed.       Assessment & Recommendations:   58 year old Caucasian female with history of presyncope due to nonsustained ventricular tachycardia 06/2017.  1. Lightheadedness Resolved.  Likely multifactorial in the setting of URI infection, possible dehydration.  She has history of nonsustained VT and this possibility cannot be excluded.  I have given her event monitor to use for 2 weeks to correlate her symptoms with any arrhythmias.  Continue diltiazem 120 mg daily.  2. NSVT (nonsustained ventricular tachycardia) (HCC) Episode in 06/2017. Plan as above  I will see her back in 3 weeks.  Elder Negus, MD [facility] Pager: [phone] Office: [phone] If no answer Cell [phone]

## 2018-07-22 NOTE — Telephone Encounter (Signed)
vm left for patient to call back and schedule an appointment with Dr. Rosemary Holms. Patient was also called on 2/25 and a vm was left for Patient to call back.

## 2018-07-24 ENCOUNTER — Encounter: Payer: Self-pay | Admitting: Cardiology

## 2018-07-24 DIAGNOSIS — I472 Ventricular tachycardia: Secondary | ICD-10-CM | POA: Insufficient documentation

## 2018-07-24 DIAGNOSIS — I4729 Other ventricular tachycardia: Secondary | ICD-10-CM | POA: Insufficient documentation

## 2018-07-24 DIAGNOSIS — R002 Palpitations: Secondary | ICD-10-CM | POA: Insufficient documentation

## 2018-07-24 DIAGNOSIS — R42 Dizziness and giddiness: Secondary | ICD-10-CM | POA: Insufficient documentation

## 2018-07-28 ENCOUNTER — Ambulatory Visit: Payer: BLUE CROSS/BLUE SHIELD | Admitting: Cardiology

## 2018-07-28 ENCOUNTER — Telehealth: Payer: Self-pay | Admitting: Cardiology

## 2018-07-28 ENCOUNTER — Encounter: Payer: Self-pay | Admitting: Cardiology

## 2018-07-28 VITALS — BP 121/69 | HR 85 | Ht 65.0 in | Wt 263.0 lb

## 2018-07-28 DIAGNOSIS — I480 Paroxysmal atrial fibrillation: Secondary | ICD-10-CM | POA: Diagnosis not present

## 2018-07-28 DIAGNOSIS — R000 Tachycardia, unspecified: Secondary | ICD-10-CM | POA: Diagnosis not present

## 2018-07-28 DIAGNOSIS — R0683 Snoring: Secondary | ICD-10-CM

## 2018-07-28 DIAGNOSIS — I10 Essential (primary) hypertension: Secondary | ICD-10-CM | POA: Diagnosis not present

## 2018-07-28 MED ORDER — SOTALOL HCL 80 MG PO TABS: 80.0000 mg | ORAL_TABLET | Freq: Two times a day (BID) | ORAL | 3 refills | Status: DC

## 2018-07-28 MED ORDER — LISINOPRIL 10 MG PO TABS: 10.0000 mg | ORAL_TABLET | Freq: Every day | ORAL | 2 refills | Status: DC

## 2018-07-28 NOTE — Progress Notes (Addendum)
Patient is here for follow up visit.  Subjective:   Amanda Graves, female    DOB: [birth year], 58 y.o.   MRN: [id number]   Chief Complaint  Patient presents with  . Palpitations    HPI  58 year old Caucasian female with history of presyncope due to nonsustained ventricular tachycardia 06/2017.  Patient's echocardiogram and nuclear stress test showed structurally normal heart with no ischemia.  Patient had been doing well on diltiazem 120 mg daily.  She was last seen by me on 07/22/2018 for symptoms of palpitations.  Workup with event monitor showed at least one episode of atrial fibrillation with RVR. Patient is here to discuss management of the same.  Past Medical History:  Diagnosis Date  . Gestational diabetes   . Miscarriage   . Preeclampsia      Past Surgical History:  Procedure Laterality Date  . BLADDER REPAIR W/ CESAREAN SECTION    . DILATION AND CURETTAGE OF UTERUS       Social History   Socioeconomic History  . Marital status: Married    Spouse name: Not on file  . Number of children: Not on file  . Years of education: Not on file  . Highest education level: Not on file  Occupational History  . Not on file  Social Needs  . Financial resource strain: Not on file  . Food insecurity:    Worry: Not on file    Inability: Not on file  . Transportation needs:    Medical: Not on file    Non-medical: Not on file  Tobacco Use  . Smoking status: Never Smoker  . Smokeless tobacco: Never Used  Substance and Sexual Activity  . Alcohol use: Yes  . Drug use: No  . Sexual activity: Not on file  Lifestyle  . Physical activity:    Days per week: Not on file    Minutes per session: Not on file  . Stress: Not on file  Relationships  . Social connections:    Talks on phone: Not on file    Gets together: Not on file    Attends religious service: Not on file    Active member of club or organization: Not on file    Attends meetings of clubs or  organizations: Not on file    Relationship status: Not on file  . Intimate partner violence:    Fear of current or ex partner: Not on file    Emotionally abused: Not on file    Physically abused: Not on file    Forced sexual activity: Not on file  Other Topics Concern  . Not on file  Social History Narrative  . Not on file     Current Outpatient Medications on File Prior to Visit  Medication Sig Dispense Refill  . CYANOCOBALAMIN PO Take 1 tablet by mouth daily.    Marland Kitchen diltiazem (TIAZAC) 120 MG 24 hr capsule Take 120 mg by mouth daily.    Marland Kitchen doxycycline (VIBRAMYCIN) 100 MG capsule Take 1 capsule by mouth daily.    Marland Kitchen omeprazole (PRILOSEC) 20 MG capsule Take 1 capsule by mouth as needed.    Marland Kitchen SAXENDA 18 MG/3ML SOPN Inject 3 mLs into the skin daily.    . Vitamin D, Cholecalciferol, 25 MCG (1000 UT) CAPS Take 2 capsules by mouth daily.     No current facility-administered medications on file prior to visit.     Cardiovascular studies:  EKG 07/28/2018: Sinus rhythm 76 bpm Normal EKG  Event  monitor alert 07/27/2018: Afib with RVR  Hospital echocardiogram 06/28/2017:  - Left ventricle: The cavity size was normal. Wall thickness was   increased in a pattern of mild LVH. Systolic function was normal.   The estimated ejection fraction was in the range of 60% to 65%.   Doppler parameters are consistent with abnormal left ventricular   relaxation (grade 1 diastolic dysfunction). - Atrial septum: No defect or patent foramen ovale was identified.  Treadmill exercise stress test 06/29/2017: Indication: Syncope The patient exercised on Bruce protocol for  07:09 min. Patient achieved  8.80 METS and reached HR  197 bpm, which is  118 % of maximum age-predicted HR.  Stress test terminated due to fatigue. Arrhythmias: none.  BP Response to Exercise: Normal resting BP- appropriate response. Chest Pain: none. ST Changes: With peak exercise there was no ST-T changes of ischemia. Exercise capacity  was below average for age. HR Response to Exercise: Exaggerated HR response. Continue primary/secondary prevention.  Review of Systems  Constitution: Negative for decreased appetite, malaise/fatigue, weight gain and weight loss.  HENT: Negative for congestion.   Eyes: Negative for visual disturbance.  Cardiovascular: Positive for near-syncope (Resolved) and palpitations (Resolved). Negative for chest pain, dyspnea on exertion, leg swelling and syncope.  Respiratory: Negative for shortness of breath.   Endocrine: Negative for cold intolerance.  Hematologic/Lymphatic: Does not bruise/bleed easily.  Skin: Negative for itching and rash.  Musculoskeletal: Negative for myalgias.  Gastrointestinal: Negative for abdominal pain, nausea and vomiting.  Genitourinary: Negative for dysuria.  Neurological: Positive for light-headedness (Resolved). Negative for weakness.  Psychiatric/Behavioral: The patient is not nervous/anxious.   All other systems reviewed and are negative.      Objective:    Vitals:   07/28/18 1622  BP: 121/69  Pulse: 85  SpO2: 96%    Physical Exam  Constitutional: She is oriented to person, place, and time. She appears well-developed and well-nourished. No distress.  Moderately obese  HENT:  Head: Normocephalic and atraumatic.  Eyes: Pupils are equal, round, and reactive to light. Conjunctivae are normal.  Neck: No JVD present.  Cardiovascular: Normal rate, regular rhythm and intact distal pulses.  No murmur heard. Pulmonary/Chest: Effort normal and breath sounds normal. She has no wheezes. She has no rales.  Abdominal: Soft. Bowel sounds are normal. There is no rebound.  Musculoskeletal:        General: No edema.  Lymphadenopathy:    She has no cervical adenopathy.  Neurological: She is alert and oriented to person, place, and time. No cranial nerve deficit.  Skin: Skin is warm and dry.  Psychiatric: She has a normal mood and affect.  Nursing note and vitals  reviewed.       Assessment & Recommendations:   58 year old Caucasian female with history of presyncope due to nonsustained ventricular tachycardia 06/2017, now with new diagnosis of atrial fibrillation  Paroxsymal atrial fibrillation: Currently in sinus rhythm.She is quite symptomatic with this. I discussed consideration of ablation vs antiarrhythmic therapy. She would like to pursue the latter. Given her h/o NSVT, I am reluctant to use flecainide. Sotalol will be a good option for her. Stopped diltiazem. Check BMP. Will initiate sotalol at 80 mg bid. First dose will be administered in the office with serial EKG monitoring. Patient has an upcoming spring break travel planned and would like to initiate the therapy prior to that.  Discussed risk factor modification, especially weight loss, BP control, and evaluation for sleep apnea. Referred to sleep study.  CHA2DS2VASc score  2. Annual stroke risk 2%. Anticoagulation not recommended (2019 ACC/AHA/HRS Afib guidelines)  Hypertension: Switched diltiazem to lisinopril 10 mg   NSVT (nonsustained ventricular tachycardia) (HCC) Episode in 06/2017. No recurrence  I will see her back in 3 weeks.  Elder Negus, MD [facility] Pager: [phone] Office: [phone] If no answer Cell [phone]

## 2018-07-28 NOTE — Telephone Encounter (Signed)
Even monitor alert on 07/27/2018: One episode of Afib at 160 bpm.  I called the patient to inform her of the findings and discuss further management. Left a voicemail.   

## 2018-07-28 NOTE — Telephone Encounter (Signed)
Patient called back. Will see her today in office.

## 2018-07-28 NOTE — Progress Notes (Signed)
Event monitor alert  Atrial fibrillation with RVR

## 2018-07-29 ENCOUNTER — Encounter: Payer: Self-pay | Admitting: Cardiology

## 2018-07-29 DIAGNOSIS — I480 Paroxysmal atrial fibrillation: Secondary | ICD-10-CM | POA: Insufficient documentation

## 2018-07-29 DIAGNOSIS — I10 Essential (primary) hypertension: Secondary | ICD-10-CM | POA: Insufficient documentation

## 2018-07-29 DIAGNOSIS — R0683 Snoring: Secondary | ICD-10-CM | POA: Insufficient documentation

## 2018-08-04 DIAGNOSIS — R002 Palpitations: Secondary | ICD-10-CM | POA: Diagnosis not present

## 2018-08-07 LAB — BASIC METABOLIC PANEL
BUN/Creatinine Ratio: 23 (ref 9–23)
BUN: 18 mg/dL (ref 6–24)
CO2: 25 mmol/L (ref 20–29)
Calcium: 9.3 mg/dL (ref 8.7–10.2)
Chloride: 102 mmol/L (ref 96–106)
Creatinine, Ser: 0.8 mg/dL (ref 0.57–1.00)
GFR calc non Af Amer: 83 mL/min/{1.73_m2} (ref >59)
GFR, EST AFRICAN AMERICAN: 96 mL/min/{1.73_m2} (ref >59)
Glucose: 107 mg/dL — ABNORMAL HIGH (ref 65–99)
Potassium: 4.8 mmol/L (ref 3.5–5.2)
Sodium: 141 mmol/L (ref 134–144)

## 2018-08-12 ENCOUNTER — Ambulatory Visit (INDEPENDENT_AMBULATORY_CARE_PROVIDER_SITE_OTHER): Payer: BLUE CROSS/BLUE SHIELD | Admitting: Cardiology

## 2018-08-12 ENCOUNTER — Ambulatory Visit: Payer: BLUE CROSS/BLUE SHIELD | Admitting: Cardiology

## 2018-08-12 ENCOUNTER — Other Ambulatory Visit: Payer: Self-pay

## 2018-08-12 VITALS — BP 156/91 | HR 72 | Ht 65.0 in | Wt 266.8 lb

## 2018-08-12 DIAGNOSIS — I480 Paroxysmal atrial fibrillation: Secondary | ICD-10-CM

## 2018-08-12 DIAGNOSIS — I10 Essential (primary) hypertension: Secondary | ICD-10-CM

## 2018-08-12 MED ORDER — LISINOPRIL 20 MG PO TABS: 10.0000 mg | ORAL_TABLET | Freq: Every day | ORAL | 2 refills | Status: DC

## 2018-08-12 NOTE — Progress Notes (Signed)
EKG 08/12/2018: #1 Sinus rhythm 67 bpm.  Normal QTc interval.  EKG 08/12/2018: #2 Sinus rhythm 64 bpm. Normal QTc interval  EKG 08/11/2017: #3 Sinus rhythm  Normal QTc interval  Three EKG's 6 hours apart without QTc increase. Continue sotalol 80 mg bid. BP elevated. Increases lisinopril to 20 mg daily.  Follow up in 6 months.  Elder Negus, MD [facility] Pager: [phone] Office: [phone] If no answer Cell [phone]

## 2018-08-31 ENCOUNTER — Telehealth: Payer: Self-pay | Admitting: Neurology

## 2018-08-31 NOTE — Telephone Encounter (Signed)
I called pt to discuss her appt and update her chart. No answer, left a message asking her to call me back. 

## 2018-08-31 NOTE — Telephone Encounter (Signed)
Due to current COVID 19 pandemic, our office is severely reducing in office visits for at least the next 2 weeks, in order to minimize the risk to our patients and healthcare providers. Our staff will contact you for next steps.  Pt understands that although there may be some limitations with this type of visit, we will take all precautions to reduce any security or privacy concerns.  Pt understands that this will be treated like an in office visit and we will file with pt's insurance, and there may be a patient responsible charge related to this service. Pt's email is [email or web address]. Pt understands that the [employer] webex software must be downloaded and operational on the device pt plans to use for the visit. Pt understands that the nurse will be calling to go over pt's chart.

## 2018-09-02 NOTE — Telephone Encounter (Signed)
I called pt. Pt's meds, allergies, and PMH were updated.  Pt has never had a sleep study but does endorse snoring. Pt reports that she wakes up with a dry mouth.  Pt was instructed on how to measure her neck size prior to her appt next week,  Pt's recent weight is 267 lb and she is 5' 4.75.  Epworth Sleepiness Scale 0= would never doze 1= slight chance of dozing 2= moderate chance of dozing 3= high chance of dozing  Sitting and reading: 1 Watching TV: 1 Sitting inactive in a public place (ex. Theater or meeting): 0 As a passenger in a car for an hour without a break: 1 Lying down to rest in the afternoon: 2 Sitting and talking to someone: 0 Sitting quietly after lunch (no alcohol): 0 In a car, while stopped in traffic: 0 Total: 3  FSS: 40

## 2018-09-02 NOTE — Addendum Note (Signed)
Addended by: Geronimo Running A on: 09/02/2018 09:59 AM   Modules accepted: Orders

## 2018-09-07 ENCOUNTER — Ambulatory Visit (INDEPENDENT_AMBULATORY_CARE_PROVIDER_SITE_OTHER): Payer: BLUE CROSS/BLUE SHIELD | Admitting: Neurology

## 2018-09-07 ENCOUNTER — Encounter: Payer: Self-pay | Admitting: Neurology

## 2018-09-07 ENCOUNTER — Other Ambulatory Visit: Payer: Self-pay

## 2018-09-07 DIAGNOSIS — R0683 Snoring: Secondary | ICD-10-CM

## 2018-09-07 DIAGNOSIS — R519 Headache, unspecified: Secondary | ICD-10-CM

## 2018-09-07 DIAGNOSIS — I480 Paroxysmal atrial fibrillation: Secondary | ICD-10-CM

## 2018-09-07 DIAGNOSIS — R351 Nocturia: Secondary | ICD-10-CM | POA: Diagnosis not present

## 2018-09-07 DIAGNOSIS — R51 Headache: Secondary | ICD-10-CM

## 2018-09-07 DIAGNOSIS — Z6841 Body Mass Index (BMI) 40.0 and over, adult: Secondary | ICD-10-CM

## 2018-09-07 NOTE — Patient Instructions (Signed)
Given verbally, during today's virtual video-based encounter, with verbal feedback received.   

## 2018-09-07 NOTE — Progress Notes (Signed)
Huston Foley, MD, PhD [facility] [address], [address] [address] [place], [place] [address]   Virtual Visit via Video Note on 09/07/2018:  I connected with Isidoro Donning on 09/07/18 at  9:00 AM EDT by a video enabled telemedicine application and verified that I am speaking with the correct person using two identifiers.   I discussed the limitations of evaluation and management by telemedicine and the availability of in person appointments. The patient expressed understanding and agreed to proceed.  History of Present Illness: Ms. Tamieka Reeb is a 58 year old right-handed woman with an underlying medical history of presyncope, paroxysmal A. fib, and morbid obesity with a BMI of over 40, with whom I am conducting a virtual, video based new patient visit via Webex in lieu of a face-to-face visit for evaluation of her sleep disorder, in particular, concern for underlying obstructive sleep apnea. The patient is unaccompanied today and joins via lap top from home. She is referred by her cardiologist, Dr. Truett Mainland, and I reviewed his notes from 07/28/18 and 08/12/18. Her Epworth sleepiness score is 3 out of 24, fatigue severely score is 40 out of 63. 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 She has been trying to lose weight and was able to lose weight but is currently plateaued in that regard, she is taking a weight loss medication. She lives with her 2 daughters. Her husband passed away 2 years ago. He had sleep apnea and used his CPAP machine so she is familiar with it. She has 5 cats in the household, one in her bedroom typically. She also watches TV in the bedroom but puts it on a sleep timer. She has a white noise machine that she uses as well. Bedtime around 9 and she is typically asleep by 9:30 but she has trouble maintaining sleep. She has nocturia about once or twice per average night. She has rare morning headaches. She reports  that her father had sleep apnea. She did not have a tonsillectomy. She has reduced her caffeine intake down to 1 cup of coffee per day. Rise time when she was working outside the home was 5 AM, currently around 7 AM.   Her Past Medical History Is Significant For: Past Medical History:  Diagnosis Date  . Gestational diabetes   . Miscarriage   . Preeclampsia     Her Past Surgical History Is Significant For: Past Surgical History:  Procedure Laterality Date  . BLADDER REPAIR W/ CESAREAN SECTION    . DILATION AND CURETTAGE OF UTERUS      Her Family History Is Significant For: Family History  Problem Relation Age of Onset  . Heart attack Mother   . Heart disease Mother   . Cancer Mother   . Dementia Father   . Cancer Father     Her Social History Is Significant For: Social History   Socioeconomic History  . Marital status: Married    Spouse name: Not on file  . Number of children: Not on file  . Years of education: Not on file  . Highest education level: Not on file  Occupational History  . Not on file  Social Needs  . Financial resource strain: Not on file  . Food insecurity:    Worry: Not on file    Inability: Not on file  . Transportation needs:    Medical: Not on file    Non-medical: Not on file  Tobacco Use  . Smoking status:  Never Smoker  . Smokeless tobacco: Never Used  Substance and Sexual Activity  . Alcohol use: Yes  . Drug use: No  . Sexual activity: Not on file  Lifestyle  . Physical activity:    Days per week: Not on file    Minutes per session: Not on file  . Stress: Not on file  Relationships  . Social connections:    Talks on phone: Not on file    Gets together: Not on file    Attends religious service: Not on file    Active member of club or organization: Not on file    Attends meetings of clubs or organizations: Not on file    Relationship status: Not on file  Other Topics Concern  . Not on file  Social History Narrative  . Not on file     Her Allergies Are:  Allergies  Allergen Reactions  . Hydrocodone-Acetaminophen Other (See Comments)    Becomes antsy and unsettled   :   Her Current Medications Are:  Outpatient Encounter Medications as of 09/07/2018  Medication Sig  . BIOTIN PO Take by mouth.  . CYANOCOBALAMIN PO Take 1 tablet by mouth daily.  Marland Kitchen lisinopril (PRINIVIL,ZESTRIL) 20 MG tablet Take 0.5 tablets (10 mg total) by mouth daily.  Marland Kitchen MELATONIN PO Take by mouth.  Marland Kitchen omeprazole (PRILOSEC) 20 MG capsule Take 1 capsule by mouth as needed.  Marland Kitchen SAXENDA 18 MG/3ML SOPN Inject 3 mLs into the skin daily.  . sotalol (BETAPACE) 80 MG tablet Take 1 tablet (80 mg total) by mouth 2 (two) times daily.  . Vitamin D, Cholecalciferol, 25 MCG (1000 UT) CAPS Take 2 capsules by mouth daily.   No facility-administered encounter medications on file as of 09/07/2018.   :   Review of Systems:  Out of a complete 14 point review of systems, all are reviewed and negative with the exception of these symptoms as listed below:  Observations/Objective: Her most recent vital signs on file for my review are from 08/12/2018: Blood pressure 156/91, pulse 72, weight 266.8 pounds for a BMI of 44.4.  Her most recent weight at home by self report is 264 pounds, neck circumference by self-report is 16 inches. On examination, she is in no acute distress. Face is symmetric, speech clear without dysarthria, hypophonia or voice tremor noted. She wears corrective eyeglasses. Facial animation is normal. Neck mobility is normal. She has no lip, neck or jaw tremor. She has on airway examination a Mallampati class II. Tonsils are not fully visualized, uvula does not appear to be large, tongue protrudes centrally and palate elevates symmetrically, tongue is normal in size. Hearing is grossly intact. Upper body movements normal. Shoulder height normal. She stands without difficulty.  Assessment and Plan: Ms. Amaiyah Nordhoff is a 58 year old right-handed woman  with an underlying medical history of presyncope, paroxysmal A. fib, and morbid obesity with a BMI of over 40, with whom I am conducting a virtual, video based new patient visit via Webex in lieu of a face-to-face visit for evaluation of an underlying organic sleep disorder, in particular, concern for obstructive sleep apnea. The patient's medical history and physical exam (albeit limited with current video-based evaluation) are concerning for a diagnosis of obstructive sleep apnea. I discussed with the patient the diagnosis of OSA, its prognosis and treatment options. I explained in particular the risks and ramifications of untreated moderate to severe OSA, especially with respect to developing cardiovascular disease down the Road, including congestive heart failure, difficult to  treat hypertension, cardiac arrhythmias, or stroke. Even type 2 diabetes has, in part, been linked to untreated OSA. Symptoms of untreated OSA may include daytime sleepiness, memory problems, mood irritability and mood disorder such as depression and anxiety, lack of energy, as well as recurrent headaches, especially morning headaches. We talked about the importance of weight control. We talked about the importance of maintaining good sleep hygiene. I recommended the following at this time: home sleep test.  I explained the sleep test procedure to the patient and also outlined possible treatment options of OSA, including the use of a custom-made dental device (which would require a referral to a specialist dentist), upper airway surgical options, (such as UPPP, which would involve a referral to an ENT). I also explained the CPAP vs. AutoPAP treatment option to the patient, who indicated that she would be willing to try CPAP if the need arises. I answered all her questions today and the patient was in agreement. I plan to see the patient back after the sleep study is completed and encouraged her to call with any interim questions,  concerns, problems or updates.   Huston FoleySaima Treyten Monestime, MD, PhD    Follow Up Instructions: 1. HST, single user, disposable unit, cloud-based data collection and retrieval. Sleep lab staff will reach out to patient to arrange for sending the unit to home address and for tutorial, making sure patient is comfortable with the unit and smart phone app. 2. Consider AutoPap therapy if home sleep test positive for obstructive sleep apnea. 3. Follow-up after starting AutoPap therapy, follow-up to be scheduled according to set-up date. 4. Pursue healthy lifestyle, continue working on weight loss. 5. Call for any interim questions or concerns.    I discussed the assessment and treatment plan with the patient. The patient was provided an opportunity to ask questions and all were answered. The patient agreed with the plan and demonstrated an understanding of the instructions.   The patient was advised to call back or seek an in-person evaluation if the symptoms worsen or if the condition fails to improve as anticipated.  I provided 30 minutes of non-face-to-face time during this encounter.   Huston FoleySaima Mozes Sagar, MD

## 2018-09-27 ENCOUNTER — Ambulatory Visit (INDEPENDENT_AMBULATORY_CARE_PROVIDER_SITE_OTHER): Payer: BLUE CROSS/BLUE SHIELD | Admitting: Neurology

## 2018-09-27 ENCOUNTER — Other Ambulatory Visit: Payer: Self-pay

## 2018-09-27 DIAGNOSIS — R51 Headache: Secondary | ICD-10-CM

## 2018-09-27 DIAGNOSIS — G4733 Obstructive sleep apnea (adult) (pediatric): Secondary | ICD-10-CM | POA: Diagnosis not present

## 2018-09-27 DIAGNOSIS — R519 Headache, unspecified: Secondary | ICD-10-CM

## 2018-09-27 DIAGNOSIS — I480 Paroxysmal atrial fibrillation: Secondary | ICD-10-CM

## 2018-09-27 DIAGNOSIS — R0683 Snoring: Secondary | ICD-10-CM

## 2018-09-27 DIAGNOSIS — R351 Nocturia: Secondary | ICD-10-CM

## 2018-09-27 DIAGNOSIS — Z6841 Body Mass Index (BMI) 40.0 and over, adult: Secondary | ICD-10-CM

## 2018-09-30 ENCOUNTER — Telehealth: Payer: Self-pay

## 2018-09-30 ENCOUNTER — Institutional Professional Consult (permissible substitution): Payer: Self-pay | Admitting: Neurology

## 2018-09-30 NOTE — Telephone Encounter (Signed)
-----   Message from Huston Foley, MD sent at 09/30/2018  8:05 AM EDT ----- Patient referred by Dr. Rosemary Holms, seen by me on 09/07/18 in VV, HST on 09/27/18.    Please call and notify the patient that the recent home sleep test showed obstructive sleep apnea. OSA is overall mild, but worth treating d/t her Hx of PAF. To that end I recommend treatment for this in the form of autoPAP, which means, that we don't have to bring her in for a sleep study with CPAP, but will let her use an autoPAP machine at home, through a DME company (of her choice, or as per insurance requirement). The DME representative will educate her on how to use the machine, how to put the mask on, etc. I have placed an order in the chart. Please send referral, talk to patient, send report to referring MD. We will need a FU in sleep clinic for 10 weeks post-PAP set up, please arrange that with me or one of our NPs. Thanks,   Huston Foley, MD, PhD [facility])

## 2018-09-30 NOTE — Procedures (Signed)
Patient Information     First Name: Amanda Last Name: Graves ID: [id number]  Birth Date: 1961-01-02 Age: 58 Gender: Female  Referring Provider: Teena Irani, PA-C BMI: 44.4 (W=267 lb, H=5' 5'')  Neck Circ.:  16 '' Epworth:  3/24 FSS: 40/63  Sleep Study Information    Study Date: Sep 27, 2018 S/H/A Version: [phone] / 4.1.1528 / 74  History: 58 year old woman with a history of presyncope, paroxysmal A. fib, and morbid obesity, who reports a family history of OSA and morning headaches.  Summary & Diagnosis:    OSA  Recommendations:      This home sleep test demonstrates mild obstructive sleep apnea with a total AHI of 12.5/hour and O2 nadir of 89%. Given the patient's medical history, in particular, paroxysmal atrial fibrillation, treatment with positive airway pressure is recommended. This can be achieved in the form of autoPAP titration/trial at home. A proper titration study with CPAP may be helpful or needed down the road, when considered safe. Please note, that untreated obstructive sleep apnea may carry additional perioperative morbidity. Patients with significant obstructive sleep apnea should receive perioperative PAP therapy and the surgeons and particularly the anesthesiologist should be informed of the diagnosis and the severity of the sleep disordered breathing. Patient will be reminded regarding compliance with her PAP machine and to be mindful of cleanliness with the equipment and timely with supply changes (i.e. changing filter, mask, hose, humidifier chamber on an ongoing basis as recommended, and cleaning parts that touch the face and nose daily, etc). The patient should be cautioned not to drive, work at heights, or operate dangerous or heavy equipment when tired or sleepy. Review and reiteration of good sleep hygiene measures should be pursued with any patient. Other causes of the patient's symptoms, including circadian rhythm disturbances, an underlying mood disorder,  medication effect and/or an underlying medical problem cannot be ruled out based on this test. Clinical correlation is recommended. The patient and her referring provider will be notified of the test results. The patient will be seen in follow up in sleep clinic at [facility] either for a face-to-face or virtual visit, whichever feasible and recommended at the time.  I certify that I have reviewed the raw data recording prior to the issuance of this report in accordance with the standards of the American Academy of Sleep Medicine (AASM).  Huston Foley, MD, PhD [facility]) Diplomat, ABPN (Neurology and Sleep)                Sleep Summary  Oxygen Saturation Statistics   Start Study Time: End Study Time: Total Recording Time:  9:17:31 PM 4:35:24 AM 7 hrs, 17 min  Total Sleep Time % REM of Sleep Time:  6 hrs, 9 min 27.2    Mean: 94 Minimum: 89 Maximum: 97  Mean of Desaturations Nadirs (%):   92  Oxygen Desaturation %: 4-9 10-20 >20 Total  Events Number Total  33 100.0  0 0.0  0 0.0  33 100.0  Oxygen Saturation: <90 <=88 <85 <80 <70  Duration (minutes): Sleep % 0.1 0.0 0.0 0.0 0.0 0.0 0.0 0.0 0.0 0.0     Respiratory Indices      Total Events REM NREM All Night  pRDI:  83  pAHI:  77 ODI:  33  pAHIc:  0  % CSR: 0.0 33.4 32.8 17.9 0.0 6.0 4.9 0.7 0.0 13.5 12.5 5.4 0.0       Pulse Rate Statistics during Sleep (BPM)  Mean: 73 Minimum: 56 Maximum:  94    Indices are calculated using technically valid sleep time of  6 hrs, 9 min. Central-Indices are calculated using technically valid sleep time of  6  hrs, 0 min. pRDI/pAHI are calculated using oxi desaturations ? 3%  Body Position Statistics  Position Supine Prone Right Left Non-Supine  Sleep (min) 302.9 0.0 15.0 52.0 67.0  Sleep % 81.9 0.0 4.1 14.1 18.1  pRDI 16.3 N/A 4.0 0.0 0.9  pAHI 15.1 N/A 4.0 0.0 0.9  ODI 6.5 N/A 0.0 0.0 0.0     Snoring Statistics Snoring Level  (dB) >40 >50 >60 >70 >80 >Threshold (45)  Sleep (min) 18.5 3.2 1.1 0.4 0.0 6.2  Sleep % 5.0 0.9 0.3 0.1 0.0 1.7    Mean: 40 dB Sleep Stages Chart                   pAHI=12.5                               Mild              Moderate                    Severe                                                 5              15                    30

## 2018-09-30 NOTE — Addendum Note (Signed)
Addended by: Huston Foley on: 09/30/2018 08:05 AM   Modules accepted: Orders

## 2018-09-30 NOTE — Progress Notes (Signed)
Patient referred by Dr. Rosemary Holms, seen by me on 09/07/18 in VV, HST on 09/27/18.    Please call and notify the patient that the recent home sleep test showed obstructive sleep apnea. OSA is overall mild, but worth treating d/t her Hx of PAF. To that end I recommend treatment for this in the form of autoPAP, which means, that we don't have to bring her in for a sleep study with CPAP, but will let her use an autoPAP machine at home, through a DME company (of her choice, or as per insurance requirement). The DME representative will educate her on how to use the machine, how to put the mask on, etc. I have placed an order in the chart. Please send referral, talk to patient, send report to referring MD. We will need a FU in sleep clinic for 10 weeks post-PAP set up, please arrange that with me or one of our NPs. Thanks,   Huston Foley, MD, PhD [facility])

## 2018-09-30 NOTE — Telephone Encounter (Signed)
I called pt. I advised pt that Dr. Frances Furbish reviewed their sleep study results and found that pt has mild osa. Dr. Frances Furbish recommends that pt start an auto pap. I reviewed PAP compliance expectations with the pt. Pt is agreeable to starting an auto-PAP. I advised pt that an order will be sent to a DME, [facility], and [facility] will call the pt within about one week after they file with the pt's insurance. [facility] will show the pt how to use the machine, fit for masks, and troubleshoot the auto-PAP if needed. A follow up appt was made for insurance purposes with Shanda Bumps, NP on 12/03/18 at 8:00am. Pt verbalized understanding to arrive 15 minutes early and bring their auto-PAP. A letter with all of this information in it will be mailed to the pt as a reminder. I verified with the pt that the address we have on file is correct. Pt verbalized understanding of results. Pt had no questions at this time but was encouraged to call back if questions arise. I have sent the order to [facility] and have received confirmation that they have received the order.

## 2018-10-11 DIAGNOSIS — I4891 Unspecified atrial fibrillation: Secondary | ICD-10-CM | POA: Diagnosis not present

## 2018-10-11 DIAGNOSIS — R112 Nausea with vomiting, unspecified: Secondary | ICD-10-CM | POA: Diagnosis not present

## 2018-10-11 DIAGNOSIS — I10 Essential (primary) hypertension: Secondary | ICD-10-CM | POA: Diagnosis not present

## 2018-10-11 DIAGNOSIS — G4739 Other sleep apnea: Secondary | ICD-10-CM | POA: Diagnosis not present

## 2018-10-14 DIAGNOSIS — G4733 Obstructive sleep apnea (adult) (pediatric): Secondary | ICD-10-CM | POA: Diagnosis not present

## 2018-11-05 ENCOUNTER — Ambulatory Visit: Payer: BLUE CROSS/BLUE SHIELD | Admitting: Cardiology

## 2018-11-14 DIAGNOSIS — G4733 Obstructive sleep apnea (adult) (pediatric): Secondary | ICD-10-CM | POA: Diagnosis not present

## 2018-11-23 ENCOUNTER — Telehealth: Payer: Self-pay | Admitting: Adult Health

## 2018-11-23 NOTE — Telephone Encounter (Signed)
I called patient regarding rescheduling her 7/10 appointment with Janett Billow NP due to our office being closed on Fridays. Requested patient call back to reschedule.

## 2018-11-25 ENCOUNTER — Encounter: Payer: Self-pay | Admitting: Adult Health

## 2018-12-03 ENCOUNTER — Ambulatory Visit: Payer: Self-pay | Admitting: Adult Health

## 2018-12-13 ENCOUNTER — Other Ambulatory Visit: Payer: Self-pay | Admitting: Cardiology

## 2018-12-13 DIAGNOSIS — I480 Paroxysmal atrial fibrillation: Secondary | ICD-10-CM

## 2018-12-13 NOTE — Telephone Encounter (Signed)
Please fill if necessary

## 2018-12-14 DIAGNOSIS — G4733 Obstructive sleep apnea (adult) (pediatric): Secondary | ICD-10-CM | POA: Diagnosis not present

## 2018-12-16 ENCOUNTER — Encounter: Payer: Self-pay | Admitting: Adult Health

## 2018-12-16 ENCOUNTER — Ambulatory Visit: Payer: BC Managed Care – PPO | Admitting: Adult Health

## 2018-12-16 ENCOUNTER — Other Ambulatory Visit: Payer: Self-pay

## 2018-12-16 VITALS — BP 124/84 | HR 57 | Temp 98.0°F | Ht 65.0 in | Wt 270.6 lb

## 2018-12-16 DIAGNOSIS — Z9989 Dependence on other enabling machines and devices: Secondary | ICD-10-CM | POA: Diagnosis not present

## 2018-12-16 DIAGNOSIS — Z789 Other specified health status: Secondary | ICD-10-CM

## 2018-12-16 DIAGNOSIS — G4733 Obstructive sleep apnea (adult) (pediatric): Secondary | ICD-10-CM

## 2018-12-16 NOTE — Progress Notes (Addendum)
PATIENT: Amanda Graves DOB: 02/13/1961  REASON FOR VISIT: follow up HISTORY FROM: patient  Chief Complaint  Patient presents with  . Follow-up    2nd treatment room, alone Paroxysmal atrial Fib/CPAP. states that she is not using it often.     HISTORY OF PRESENT ILLNESS: HISTORY   Ms. Amanda Graves is a 58 year old female with underlying medical history of presyncope, proximal A. fib and morbid obesity who is being seen today for initial CPAP compliance visit.  She was initially evaluated via virtual visit on 09/07/2018 with Dr. Frances FurbishAthar with complaints of insomnia and underlying cardiac history with atrial fibrillation.  She was referred to [facility] by her cardiologist.  She underwent sleep study on 09/27/2018 via HST which showed mild OSA but recommended treating due to history of PAF.  CPAP compliance report from 11/14/2018 -12/13/2018 shows 12 out of 30 usage days with only 2 days greater than 4 hours for overall compliance of 7%.  Average usage 2 hours and 38 minutes with a residual AHI 1.4.  Leaks in the 95th percentile 5.7.  Pressure in the 95th percentile 9.2.  Minimum pressure 5 and max pressure 11 with a EPR level 3.  She has been having difficulty tolerating due to feeling claustrophobia which is worse upon initially starting as a pressure will be a 4 and feels as though she is not getting enough air.  She will end up having increased anxiety therefore remove the mask and being unable to be placed.  Currently using nasal pillows.  She is questioning potential use of full facemask as recommended by her DME company.  She does continue to have insomnia and is frustrated as she felt as though CPAP use should help this.  FSS 18 and ESS 4.  No further concerns at this time.     REVIEW OF SYSTEMS: Out of a complete 14 system review of symptoms, the patient complains only of the following symptoms, and all other reviewed systems are negative. Insomnia  ALLERGIES: Allergies   Allergen Reactions  . Hydrocodone-Acetaminophen Other (See Comments)    Becomes antsy and unsettled     HOME MEDICATIONS: Outpatient Medications Prior to Visit  Medication Sig Dispense Refill  . BIOTIN PO Take by mouth.    . CYANOCOBALAMIN PO Take 1 tablet by mouth daily.    Marland Kitchen. lisinopril (PRINIVIL,ZESTRIL) 20 MG tablet Take 0.5 tablets (10 mg total) by mouth daily. 90 tablet 2  . MELATONIN PO Take by mouth.    Marland Kitchen. SAXENDA 18 MG/3ML SOPN Inject 3 mLs into the skin daily.    . sotalol (BETAPACE) 80 MG tablet TAKE 1 TABLET(80 MG) BY MOUTH TWICE DAILY 180 tablet 1  . Vitamin D, Cholecalciferol, 25 MCG (1000 UT) CAPS Take 2 capsules by mouth daily.    Marland Kitchen. omeprazole (PRILOSEC) 20 MG capsule Take 1 capsule by mouth as needed.     No facility-administered medications prior to visit.     PAST MEDICAL HISTORY: Past Medical History:  Diagnosis Date  . Gestational diabetes   . Miscarriage   . Preeclampsia     PAST SURGICAL HISTORY: Past Surgical History:  Procedure Laterality Date  . BLADDER REPAIR W/ CESAREAN SECTION    . DILATION AND CURETTAGE OF UTERUS      FAMILY HISTORY: Family History  Problem Relation Age of Onset  . Heart attack Mother   . Heart disease Mother   . Cancer Mother   . Dementia Father   . Cancer Father  SOCIAL HISTORY: Social History   Socioeconomic History  . Marital status: Married    Spouse name: Not on file  . Number of children: Not on file  . Years of education: Not on file  . Highest education level: Not on file  Occupational History  . Not on file  Social Needs  . Financial resource strain: Not on file  . Food insecurity    Worry: Not on file    Inability: Not on file  . Transportation needs    Medical: Not on file    Non-medical: Not on file  Tobacco Use  . Smoking status: Never Smoker  . Smokeless tobacco: Never Used  Substance and Sexual Activity  . Alcohol use: Yes  . Drug use: No  . Sexual activity: Not on file  Lifestyle   . Physical activity    Days per week: Not on file    Minutes per session: Not on file  . Stress: Not on file  Relationships  . Social Musicianconnections    Talks on phone: Not on file    Gets together: Not on file    Attends religious service: Not on file    Active member of club or organization: Not on file    Attends meetings of clubs or organizations: Not on file    Relationship status: Not on file  . Intimate partner violence    Fear of current or ex partner: Not on file    Emotionally abused: Not on file    Physically abused: Not on file    Forced sexual activity: Not on file  Other Topics Concern  . Not on file  Social History Narrative  . Not on file      PHYSICAL EXAM  Vitals:   12/16/18 0835  BP: 124/84  Pulse: (!) 57  Temp: 98 F (36.7 C)  Weight: 270 lb 9.6 oz (122.7 kg)  Height: 5\' 5"  (1.651 m)   Body mass index is 45.03 kg/m.  Generalized: Well developed, pleasant middle-aged Caucasian female, in no acute distress   Neurological examination  Mentation: Alert oriented to time, place, history taking. Follows all commands speech and language fluent Cranial nerve II-XII: Pupils were equal round reactive to light. Extraocular movements were full, visual field were full on confrontational test. Facial sensation and strength were normal. Uvula tongue midline. Head turning and shoulder shrug  were normal and symmetric. Motor: The motor testing reveals 5 over 5 strength of all 4 extremities. Good symmetric motor tone is noted throughout.  Sensory: Sensory testing is intact to soft touch on all 4 extremities. No evidence of extinction is noted.  Coordination: Cerebellar testing reveals good finger-nose-finger and heel-to-shin bilaterally.  Gait and station: Gait is normal. Tandem gait is normal. Romberg is negative. No drift is seen.  Reflexes: Deep tendon reflexes are symmetric and normal bilaterally.   DIAGNOSTIC DATA (LABS, IMAGING, TESTING) - I reviewed patient  records, labs, notes, testing and imaging myself where available.  Home sleep test 09/27/2018 Recommendations:    This home sleep test demonstrates mild obstructive sleep apnea  with a total AHI of 12.5/hour and O2 nadir of 89%. Given the  patient's medical history, in particular, paroxysmal atrial  fibrillation, treatment with positive airway pressure is  recommended.     Lab Results  Component Value Date   WBC 8.4 06/27/2017   HGB 14.3 06/27/2017   HCT 42.9 06/27/2017   MCV 87.4 06/27/2017   PLT 230 06/27/2017  Component Value Date/Time   NA 141 08/06/2018 1130   K 4.8 08/06/2018 1130   CL 102 08/06/2018 1130   CO2 25 08/06/2018 1130   GLUCOSE 107 (H) 08/06/2018 1130   GLUCOSE 96 06/28/2017 0547   BUN 18 08/06/2018 1130   CREATININE 0.80 08/06/2018 1130   CALCIUM 9.3 08/06/2018 1130   GFRNONAA 83 08/06/2018 1130   GFRAA 96 08/06/2018 1130   No results found for: CHOL, HDL, LDLCALC, LDLDIRECT, TRIG, CHOLHDL No results found for: HGBA1C No results found for: VITAMINB12 Lab Results  Component Value Date   TSH 3.250 06/28/2017      ASSESSMENT AND PLAN 59 y.o. year old female with underlying medical history of presyncope, proximal A. fib and morbid obesity who was referred to our office by cardiologist with initial telemedicine visit via video with Dr Rhunette Croft on 09/07/2018 for evaluation of potential sleep apnea..  She is being seen today for initial CPAP compliance visit as her HST did show evidence of mild OSA.  Compliance report demonstrates poor overall compliance due to difficulty tolerating with complaints of claustrophobia and increased anxiety.  Recommend increasing minimum pressure from 5 to 7 as she feels as though initially she is not receiving enough air.  She is agreeable to trying.  Also advised her to use mask during short intervals during the day when able such as watching TV in hopes of improving overall tolerance and decreasing anxiety.  She will call  in 1 month's time to update on how she is doing with new pressure and obtain new download report.  Discussion regarding potential use of dental device as possible treatment of her mild OSA if approved by Dr. Rexene Alberts.  She will continue to contact her DME company [facility] with any concerns or needs supplies  She will return for an office visit in 6 months with Jinny Blossom, NP  Greater than 50% of this 15-minute visit was spent discussing for CPAP compliance with discussion of ongoing difficulties and potential use of different treatment options if she continues to experience difficulty tolerating   Venancio Poisson, MSN, AGNP-BC 12/16/2018, 9:22 AM [facility] [address] [place], [place] [address] [phone]  I reviewed the above note and documentation by the Nurse Practitioner and agree with the history, physical exam, assessment and plan as outlined above. I was immediately available for consultation. Star Age, MD, PhD [facility])

## 2018-12-16 NOTE — Patient Instructions (Signed)
Your Plan:  Increase min pressure from 5 to 7 to see if this helps   Try using while watching TV to help get your body adjusted  Call office after 1 month and if unable to tolerate, we can talk about pursing a different option   Follow up in 6 months with Jinny Blossom, NP     Thank you for coming to see [place] at [facility]. I hope we have been able to provide you high quality care today.  You may receive a patient satisfaction survey over the next few weeks. We would appreciate your feedback and comments so that we may continue to improve ourselves and the health of our patients.

## 2018-12-16 NOTE — Progress Notes (Signed)
Amanda Graves received orders for cpap. sy

## 2018-12-18 DIAGNOSIS — N3000 Acute cystitis without hematuria: Secondary | ICD-10-CM | POA: Diagnosis not present

## 2019-01-14 DIAGNOSIS — G4733 Obstructive sleep apnea (adult) (pediatric): Secondary | ICD-10-CM | POA: Diagnosis not present

## 2019-02-18 ENCOUNTER — Ambulatory Visit: Payer: BLUE CROSS/BLUE SHIELD | Admitting: Cardiology

## 2019-03-04 ENCOUNTER — Ambulatory Visit: Payer: BLUE CROSS/BLUE SHIELD | Admitting: Cardiology

## 2019-03-04 ENCOUNTER — Encounter: Payer: Self-pay | Admitting: Cardiology

## 2019-03-04 ENCOUNTER — Other Ambulatory Visit: Payer: Self-pay

## 2019-03-04 VITALS — BP 140/78 | HR 76 | Temp 98.0°F | Ht 65.0 in | Wt 278.0 lb

## 2019-03-04 DIAGNOSIS — I480 Paroxysmal atrial fibrillation: Secondary | ICD-10-CM

## 2019-03-04 DIAGNOSIS — I472 Ventricular tachycardia: Secondary | ICD-10-CM | POA: Diagnosis not present

## 2019-03-04 DIAGNOSIS — I4729 Other ventricular tachycardia: Secondary | ICD-10-CM

## 2019-03-04 MED ORDER — SOTALOL HCL 80 MG PO TABS: 80.0000 mg | ORAL_TABLET | Freq: Two times a day (BID) | ORAL | 2 refills | Status: DC

## 2019-03-04 MED ORDER — LISINOPRIL 10 MG PO TABS: 10.0000 mg | ORAL_TABLET | Freq: Every day | ORAL | 2 refills | Status: DC

## 2019-03-04 NOTE — Progress Notes (Signed)
Patient is here for follow up visit.  Subjective:   Amanda Graves, female    DOB: 04-Nov-1960, 58 y.o.   MRN: [id number]   Chief Complaint  Patient presents with  . Atrial Fibrillation  . Follow-up    3 month    58 year old Caucasian female with PAF, NSVT,  h/o presyncope.  She is doing well without any recurrence of palpitation symptoms. She is compliant with medical therapy, but could not tolerate CPAP.  Past Medical History:  Diagnosis Date  . Gestational diabetes   . Miscarriage   . Preeclampsia      Past Surgical History:  Procedure Laterality Date  . BLADDER REPAIR W/ CESAREAN SECTION    . DILATION AND CURETTAGE OF UTERUS       Social History   Socioeconomic History  . Marital status: Widowed    Spouse name: Not on file  . Number of children: 2  . Years of education: Not on file  . Highest education level: Not on file  Occupational History  . Not on file  Social Needs  . Financial resource strain: Not on file  . Food insecurity    Worry: Not on file    Inability: Not on file  . Transportation needs    Medical: Not on file    Non-medical: Not on file  Tobacco Use  . Smoking status: Never Smoker  . Smokeless tobacco: Never Used  Substance and Sexual Activity  . Alcohol use: Yes  . Drug use: No  . Sexual activity: Not on file  Lifestyle  . Physical activity    Days per week: Not on file    Minutes per session: Not on file  . Stress: Not on file  Relationships  . Social Musician on phone: Not on file    Gets together: Not on file    Attends religious service: Not on file    Active member of club or organization: Not on file    Attends meetings of clubs or organizations: Not on file    Relationship status: Not on file  . Intimate partner violence    Fear of current or ex partner: Not on file    Emotionally abused: Not on file    Physically abused: Not on file    Forced sexual activity: Not on file  Other Topics Concern   . Not on file  Social History Narrative  . Not on file     Current Outpatient Medications on File Prior to Visit  Medication Sig Dispense Refill  . BIOTIN PO Take by mouth.    . CYANOCOBALAMIN PO Take 1 tablet by mouth daily.    Marland Kitchen lisinopril (ZESTRIL) 10 MG tablet Take 10 mg by mouth daily.    Marland Kitchen MELATONIN PO Take by mouth.    Marland Kitchen SAXENDA 18 MG/3ML SOPN Inject 3 mLs into the skin daily.    . sotalol (BETAPACE) 80 MG tablet TAKE 1 TABLET(80 MG) BY MOUTH TWICE DAILY 180 tablet 1  . Vitamin D, Cholecalciferol, 25 MCG (1000 UT) CAPS Take 2 capsules by mouth daily.     No current facility-administered medications on file prior to visit.     Cardiovascular studies:  EKG 03/04/2019: Sinus rhythm 63 bpm.  Low voltage precordial leads.  Normal QTc interval.  EKG 07/28/2018: Sinus rhythm 76 bpm Normal EKG  Event monitor alert 07/27/2018: Afib with RVR  Hospital echocardiogram 06/28/2017:  - Left ventricle: The cavity size was normal. Wall  thickness was   increased in a pattern of mild LVH. Systolic function was normal.   The estimated ejection fraction was in the range of 60% to 65%.   Doppler parameters are consistent with abnormal left ventricular   relaxation (grade 1 diastolic dysfunction). - Atrial septum: No defect or patent foramen ovale was identified.  Treadmill exercise stress test 06/29/2017: Indication: Syncope The patient exercised on Bruce protocol for  07:09 min. Patient achieved  8.80 METS and reached HR  197 bpm, which is  118 % of maximum age-predicted HR.  Stress test terminated due to fatigue. Arrhythmias: none.  BP Response to Exercise: Normal resting BP- appropriate response. Chest Pain: none. ST Changes: With peak exercise there was no ST-T changes of ischemia. Exercise capacity was below average for age. HR Response to Exercise: Exaggerated HR response. Continue primary/secondary prevention.  Review of Systems  Constitution: Negative for decreased appetite,  malaise/fatigue, weight gain and weight loss.  HENT: Negative for congestion.   Eyes: Negative for visual disturbance.  Cardiovascular: Positive for near-syncope (Resolved) and palpitations (Resolved). Negative for chest pain, dyspnea on exertion, leg swelling and syncope.  Respiratory: Negative for shortness of breath.   Endocrine: Negative for cold intolerance.  Hematologic/Lymphatic: Does not bruise/bleed easily.  Skin: Negative for itching and rash.  Musculoskeletal: Negative for myalgias.  Gastrointestinal: Negative for abdominal pain, nausea and vomiting.  Genitourinary: Negative for dysuria.  Neurological: Positive for light-headedness (Resolved). Negative for weakness.  Psychiatric/Behavioral: The patient is not nervous/anxious.   All other systems reviewed and are negative.      Objective:    Vitals:   03/04/19 1504  BP: 140/78  Pulse: 76  Temp: 98 F (36.7 C)  SpO2: 98%    Physical Exam  Constitutional: She is oriented to person, place, and time. She appears well-developed and well-nourished. No distress.  Moderately obese  HENT:  Head: Normocephalic and atraumatic.  Eyes: Pupils are equal, round, and reactive to light. Conjunctivae are normal.  Neck: No JVD present.  Cardiovascular: Normal rate, regular rhythm and intact distal pulses.  No murmur heard. Pulmonary/Chest: Effort normal and breath sounds normal. She has no wheezes. She has no rales.  Abdominal: Soft. Bowel sounds are normal. There is no rebound.  Musculoskeletal:        General: No edema.  Lymphadenopathy:    She has no cervical adenopathy.  Neurological: She is alert and oriented to person, place, and time. No cranial nerve deficit.  Skin: Skin is warm and dry.  Psychiatric: She has a normal mood and affect.  Nursing note and vitals reviewed.       Assessment & Recommendations:   58 year old Caucasian female with PAF, NSVT,  h/o presyncope.  Paroxsymal atrial fibrillation: Maintaining  sinus rhythm on sotalol 80 mg bid. Normal QTc interval.   CHA2DS2VASc score 2. Annual stroke risk 2%. Anticoagulation not recommended (2019 ACC/AHA/HRS Afib guidelines)  Hypertension: Controlled.  NSVT (nonsustained ventricular tachycardia) ([place]) Episode in 06/2017. No recurrence  I will see her back in 6 months with repeat BMP.  Nigel Mormon, MD [facility] Pager: [phone] Office: [phone] If no answer Cell [phone]

## 2019-03-06 ENCOUNTER — Encounter: Payer: Self-pay | Admitting: Cardiology

## 2019-06-22 ENCOUNTER — Ambulatory Visit: Payer: BC Managed Care – PPO | Admitting: Adult Health

## 2019-07-01 DIAGNOSIS — Z1231 Encounter for screening mammogram for malignant neoplasm of breast: Secondary | ICD-10-CM | POA: Diagnosis not present

## 2019-08-04 DIAGNOSIS — Z23 Encounter for immunization: Secondary | ICD-10-CM | POA: Diagnosis not present

## 2019-08-29 ENCOUNTER — Other Ambulatory Visit (HOSPITAL_COMMUNITY): Payer: Self-pay | Admitting: Cardiology

## 2019-08-29 DIAGNOSIS — I480 Paroxysmal atrial fibrillation: Secondary | ICD-10-CM | POA: Diagnosis not present

## 2019-08-30 LAB — BASIC METABOLIC PANEL
BUN/Creatinine Ratio: 12 (ref 9–23)
BUN: 9 mg/dL (ref 6–24)
CO2: 25 mmol/L (ref 20–29)
Calcium: 9.2 mg/dL (ref 8.7–10.2)
Chloride: 107 mmol/L — ABNORMAL HIGH (ref 96–106)
Creatinine, Ser: 0.78 mg/dL (ref 0.57–1.00)
GFR calc Af Amer: 98 mL/min/{1.73_m2} (ref >59)
GFR calc non Af Amer: 85 mL/min/{1.73_m2} (ref >59)
Glucose: 87 mg/dL (ref 65–99)
Potassium: 4.2 mmol/L (ref 3.5–5.2)
Sodium: 144 mmol/L (ref 134–144)

## 2019-09-02 ENCOUNTER — Other Ambulatory Visit: Payer: Self-pay

## 2019-09-02 ENCOUNTER — Ambulatory Visit: Payer: BC Managed Care – PPO | Admitting: Cardiology

## 2019-09-02 ENCOUNTER — Encounter: Payer: Self-pay | Admitting: Cardiology

## 2019-09-02 VITALS — BP 130/84 | HR 77 | Temp 98.2°F | Ht 65.0 in | Wt 285.4 lb

## 2019-09-02 DIAGNOSIS — I472 Ventricular tachycardia: Secondary | ICD-10-CM

## 2019-09-02 DIAGNOSIS — I10 Essential (primary) hypertension: Secondary | ICD-10-CM

## 2019-09-02 DIAGNOSIS — Z110 Encounter for screening for intestinal infectious diseases: Secondary | ICD-10-CM

## 2019-09-02 DIAGNOSIS — Z13220 Encounter for screening for lipoid disorders: Secondary | ICD-10-CM

## 2019-09-02 DIAGNOSIS — I4729 Other ventricular tachycardia: Secondary | ICD-10-CM

## 2019-09-02 DIAGNOSIS — I480 Paroxysmal atrial fibrillation: Secondary | ICD-10-CM | POA: Diagnosis not present

## 2019-09-02 MED ORDER — SOTALOL HCL 80 MG PO TABS: 80.0000 mg | ORAL_TABLET | Freq: Two times a day (BID) | ORAL | 3 refills | Status: DC

## 2019-09-02 MED ORDER — LISINOPRIL 10 MG PO TABS: 10.0000 mg | ORAL_TABLET | Freq: Every day | ORAL | 3 refills | Status: DC

## 2019-09-02 NOTE — Progress Notes (Signed)
Patient is here for follow up visit.  Subjective:   Amanda Graves, female    DOB: [birth year], 59 y.o.   MRN: [id number]   Chief Complaint  Patient presents with  . Atrial Fibrillation    59 year old Caucasian female with PAF, NSVT,  h/o presyncope.  She is doing well without any recurrence of palpitation symptoms. She has had only one episode of palpitations. Labs reviewed with the patient.   Current Outpatient Medications on File Prior to Visit  Medication Sig Dispense Refill  . BIOTIN PO Take by mouth.    . CYANOCOBALAMIN PO Take 1 tablet by mouth daily.    Marland Kitchen lisinopril (ZESTRIL) 10 MG tablet Take 1 tablet (10 mg total) by mouth daily. 90 tablet 2  . MELATONIN PO Take by mouth.    Marland Kitchen SAXENDA 18 MG/3ML SOPN Inject 3 mLs into the skin daily.    . sotalol (BETAPACE) 80 MG tablet Take 1 tablet (80 mg total) by mouth 2 (two) times daily. 180 tablet 2  . Vitamin D, Cholecalciferol, 25 MCG (1000 UT) CAPS Take 2 capsules by mouth daily.     No current facility-administered medications on file prior to visit.    Cardiovascular studies:  EKG 09/02/2019: Sinus rhythm 63 bpm.   Low voltage in limb leads.  Normal QTc interval  Event monitor alert 07/27/2018: Afib with RVR  Hospital echocardiogram 06/28/2017:  - Left ventricle: The cavity size was normal. Wall thickness was   increased in a pattern of mild LVH. Systolic function was normal.   The estimated ejection fraction was in the range of 60% to 65%.   Doppler parameters are consistent with abnormal left ventricular   relaxation (grade 1 diastolic dysfunction). - Atrial septum: No defect or patent foramen ovale was identified.  Treadmill exercise stress test 06/29/2017: Indication: Syncope The patient exercised on Bruce protocol for  07:09 min. Patient achieved  8.80 METS and reached HR  197 bpm, which is  118 % of maximum age-predicted HR.  Stress test terminated due to fatigue. Arrhythmias: none.  BP Response to  Exercise: Normal resting BP- appropriate response. Chest Pain: none. ST Changes: With peak exercise there was no ST-T changes of ischemia. Exercise capacity was below average for age. HR Response to Exercise: Exaggerated HR response. Continue primary/secondary prevention.  Recent labs: 08/29/2019: Glucose 87, BUN/Cr 9/0.78. EGFR 85. Na/K 144/4.2.    Review of Systems  Cardiovascular: Negative for chest pain, dyspnea on exertion, leg swelling, palpitations and syncope.       Objective:    Vitals:   09/02/19 1426  BP: 130/84  Pulse: 77  Temp: 98.2 F (36.8 C)  SpO2: 99%      Physical Exam  Constitutional: She appears well-developed and well-nourished.  Neck: No JVD present.  Cardiovascular: Normal rate, regular rhythm, normal heart sounds and intact distal pulses.  No murmur heard. Pulmonary/Chest: Effort normal and breath sounds normal. She has no wheezes. She has no rales.  Musculoskeletal:        General: No edema.  Nursing note and vitals reviewed.       Assessment & Recommendations:   59 year old Caucasian female with PAF, NSVT,  h/o presyncope.  Paroxsymal atrial fibrillation: Maintaining sinus rhythm on sotalol 80 mg bid. Normal QTc interval.   CHA2DS2VASc score 2. Annual stroke risk 2%. Anticoagulation not recommended (2019 ACC/AHA/HRS Afib guidelines)  Hypertension: Controlled.  NSVT (nonsustained ventricular tachycardia) ([place]) Episode in 06/2017. No recurrence  F/u in 1 year.  Nigel Mormon, MD [facility] Pager: [phone] Office: [phone] If no answer Cell [phone]

## 2019-09-03 DIAGNOSIS — Z23 Encounter for immunization: Secondary | ICD-10-CM | POA: Diagnosis not present

## 2019-11-28 ENCOUNTER — Other Ambulatory Visit: Payer: Self-pay | Admitting: Cardiology

## 2019-11-28 DIAGNOSIS — I480 Paroxysmal atrial fibrillation: Secondary | ICD-10-CM

## 2020-07-06 DIAGNOSIS — Z1231 Encounter for screening mammogram for malignant neoplasm of breast: Secondary | ICD-10-CM | POA: Diagnosis not present

## 2020-08-11 ENCOUNTER — Other Ambulatory Visit: Payer: Self-pay | Admitting: Cardiology

## 2020-08-11 DIAGNOSIS — I480 Paroxysmal atrial fibrillation: Secondary | ICD-10-CM

## 2020-08-28 DIAGNOSIS — Z13220 Encounter for screening for lipoid disorders: Secondary | ICD-10-CM | POA: Diagnosis not present

## 2020-08-28 DIAGNOSIS — I480 Paroxysmal atrial fibrillation: Secondary | ICD-10-CM | POA: Diagnosis not present

## 2020-08-29 LAB — BASIC METABOLIC PANEL
BUN/Creatinine Ratio: 11 (ref 9–23)
BUN: 11 mg/dL (ref 6–24)
CO2: 23 mmol/L (ref 20–29)
Calcium: 9.3 mg/dL (ref 8.7–10.2)
Chloride: 102 mmol/L (ref 96–106)
Creatinine, Ser: 0.96 mg/dL (ref 0.57–1.00)
Glucose: 99 mg/dL (ref 65–99)
Potassium: 4.2 mmol/L (ref 3.5–5.2)
Sodium: 141 mmol/L (ref 134–144)
eGFR: 69 mL/min/{1.73_m2} (ref >59)

## 2020-08-29 LAB — LIPID PANEL
Chol/HDL Ratio: 3.8 ratio (ref 0.0–4.4)
Cholesterol, Total: 182 mg/dL (ref 100–199)
HDL: 48 mg/dL (ref >39)
LDL Chol Calc (NIH): 113 mg/dL — ABNORMAL HIGH (ref 0–99)
Triglycerides: 115 mg/dL (ref 0–149)
VLDL Cholesterol Cal: 21 mg/dL (ref 5–40)

## 2020-08-31 ENCOUNTER — Ambulatory Visit: Payer: BC Managed Care – PPO | Admitting: Cardiology

## 2020-08-31 ENCOUNTER — Encounter: Payer: Self-pay | Admitting: Cardiology

## 2020-08-31 ENCOUNTER — Other Ambulatory Visit: Payer: Self-pay

## 2020-08-31 VITALS — BP 133/76 | HR 61 | Temp 98.1°F | Resp 16 | Ht 65.0 in | Wt 268.0 lb

## 2020-08-31 DIAGNOSIS — I480 Paroxysmal atrial fibrillation: Secondary | ICD-10-CM | POA: Diagnosis not present

## 2020-08-31 DIAGNOSIS — I10 Essential (primary) hypertension: Secondary | ICD-10-CM | POA: Diagnosis not present

## 2020-08-31 MED ORDER — SOTALOL HCL 80 MG PO TABS: 80.0000 mg | ORAL_TABLET | Freq: Two times a day (BID) | ORAL | 3 refills | Status: DC

## 2020-08-31 MED ORDER — LISINOPRIL 10 MG PO TABS: 10.0000 mg | ORAL_TABLET | Freq: Every day | ORAL | 3 refills | Status: DC

## 2020-08-31 NOTE — Progress Notes (Signed)
Patient is here for follow up visit.  Subjective:   Amanda Graves, female    DOB: [birth year], 60 y.o.   MRN: [id number]   Chief Complaint  Patient presents with  . Atrial Fibrillation  . Follow-up    19 year    60 year old Caucasian female with PAF, NSVT,  h/o presyncope.  She is doing well , has lost 28 lbs. BP better at home. She has only occasional palpitations, like when phone goes off after she has slept. Reviewed llabs.   Current Outpatient Medications on File Prior to Visit  Medication Sig Dispense Refill  . BIOTIN PO Take by mouth.    . CYANOCOBALAMIN PO Take 1 tablet by mouth daily.    Marland Kitchen lisinopril (ZESTRIL) 10 MG tablet TAKE 1 TABLET(10 MG) BY MOUTH DAILY 90 tablet 3  . MELATONIN PO Take by mouth.    . sotalol (BETAPACE) 80 MG tablet TAKE 1 TABLET(80 MG) BY MOUTH TWICE DAILY 180 tablet 3  . Vitamin D, Cholecalciferol, 25 MCG (1000 UT) CAPS Take 2 capsules by mouth daily.     No current facility-administered medications on file prior to visit.    Cardiovascular studies:  EKG 08/31/2020: Sinus rhythm 57 bpm Occasional PAC Low voltage QTc interval 405 msec  Event monitor alert 07/27/2018: Afib with RVR  Hospital echocardiogram 06/28/2017:  - Left ventricle: The cavity size was normal. Wall thickness was   increased in a pattern of mild LVH. Systolic function was normal.   The estimated ejection fraction was in the range of 60% to 65%.   Doppler parameters are consistent with abnormal left ventricular   relaxation (grade 1 diastolic dysfunction). - Atrial septum: No defect or patent foramen ovale was identified.  Treadmill exercise stress test 06/29/2017: Indication: Syncope The patient exercised on Bruce protocol for  07:09 min. Patient achieved  8.80 METS and reached HR  197 bpm, which is  118 % of maximum age-predicted HR.  Stress test terminated due to fatigue. Arrhythmias: none.  BP Response to Exercise: Normal resting BP- appropriate response.  Chest Pain: none. ST Changes: With peak exercise there was no ST-T changes of ischemia. Exercise capacity was below average for age. HR Response to Exercise: Exaggerated HR response. Continue primary/secondary prevention.  Recent labs: 08/28/2020: Glucose 99, BUN/Cr 11/0.96. EGFR 69. Na/K 141/4.2.  Chol 182, TG 115, HDL 48, LDL 113   08/29/2019: Glucose 87, BUN/Cr 9/0.78. EGFR 85. Na/K 144/4.2.    Review of Systems  Cardiovascular: Negative for chest pain, dyspnea on exertion, leg swelling, palpitations and syncope.       Objective:    Vitals:   08/31/20 1435  BP: (!) 152/78  Pulse: 63  Resp: 16  Temp: 98.1 F (36.7 C)  SpO2: 98%      Physical Exam Vitals and nursing note reviewed.  Constitutional:      Appearance: She is well-developed.  Neck:     Vascular: No JVD.  Cardiovascular:     Rate and Rhythm: Normal rate and regular rhythm.     Pulses: Intact distal pulses.     Heart sounds: Normal heart sounds. No murmur heard.   Pulmonary:     Effort: Pulmonary effort is normal.     Breath sounds: Normal breath sounds. No wheezing or rales.         Assessment & Recommendations:   60 year old Caucasian female with PAF, NSVT,  h/o presyncope.  Paroxsymal atrial fibrillation: Maintaining sinus rhythm on sotalol 80 mg bid. Normal  QTc interval.   CHA2DS2VASc score 2. Annual stroke risk 2%. Anticoagulation not recommended (2019 ACC/AHA/HRS Afib guidelines)  Hypertension: Controlled. Refilled lisinopril 10 mg.  Reviewed lipid panel. 10 yr ASCVD risk 2/9%. No statin.   NSVT (nonsustained ventricular tachycardia) ([place]) Episode in 06/2017. No recurrence   F/u in 1 year.   Nigel Mormon, MD [facility] Pager: [phone] Office: [phone] If no answer Cell [phone]

## 2020-12-27 DIAGNOSIS — I480 Paroxysmal atrial fibrillation: Secondary | ICD-10-CM | POA: Diagnosis not present

## 2020-12-27 DIAGNOSIS — Z0000 Encounter for general adult medical examination without abnormal findings: Secondary | ICD-10-CM | POA: Diagnosis not present

## 2020-12-27 DIAGNOSIS — Z131 Encounter for screening for diabetes mellitus: Secondary | ICD-10-CM | POA: Diagnosis not present

## 2020-12-27 DIAGNOSIS — I10 Essential (primary) hypertension: Secondary | ICD-10-CM | POA: Diagnosis not present

## 2021-07-12 DIAGNOSIS — Z1231 Encounter for screening mammogram for malignant neoplasm of breast: Secondary | ICD-10-CM | POA: Diagnosis not present

## 2021-08-30 ENCOUNTER — Ambulatory Visit: Payer: BC Managed Care – PPO | Admitting: Cardiology

## 2021-09-25 ENCOUNTER — Other Ambulatory Visit: Payer: Self-pay | Admitting: Cardiology

## 2021-09-25 DIAGNOSIS — I10 Essential (primary) hypertension: Secondary | ICD-10-CM

## 2021-09-25 DIAGNOSIS — I480 Paroxysmal atrial fibrillation: Secondary | ICD-10-CM

## 2021-09-26 ENCOUNTER — Ambulatory Visit: Payer: BC Managed Care – PPO | Admitting: Cardiology

## 2021-09-26 NOTE — Telephone Encounter (Signed)
Not yet. I will address after seeing the patient on 5/12. Need to review EKG then. ? ?Thanks ?MJP ? ?

## 2021-09-26 NOTE — Telephone Encounter (Signed)
Okay to refill Sotalol?

## 2021-10-01 ENCOUNTER — Other Ambulatory Visit: Payer: Self-pay

## 2021-10-01 DIAGNOSIS — I480 Paroxysmal atrial fibrillation: Secondary | ICD-10-CM

## 2021-10-01 DIAGNOSIS — E782 Mixed hyperlipidemia: Secondary | ICD-10-CM | POA: Diagnosis not present

## 2021-10-02 LAB — LIPID PANEL WITH LDL/HDL RATIO
Cholesterol, Total: 235 mg/dL — ABNORMAL HIGH (ref 100–199)
HDL: 57 mg/dL (ref >39)
LDL Chol Calc (NIH): 155 mg/dL — ABNORMAL HIGH (ref 0–99)
LDL/HDL Ratio: 2.7 ratio (ref 0.0–3.2)
Triglycerides: 127 mg/dL (ref 0–149)
VLDL Cholesterol Cal: 23 mg/dL (ref 5–40)

## 2021-10-02 LAB — BMP8+EGFR
BUN/Creatinine Ratio: 13 (ref 9–23)
BUN: 10 mg/dL (ref 6–24)
CO2: 24 mmol/L (ref 20–29)
Calcium: 9.3 mg/dL (ref 8.7–10.2)
Chloride: 102 mmol/L (ref 96–106)
Creatinine, Ser: 0.8 mg/dL (ref 0.57–1.00)
Glucose: 92 mg/dL (ref 70–99)
Potassium: 4.6 mmol/L (ref 3.5–5.2)
Sodium: 140 mmol/L (ref 134–144)
eGFR: 85 mL/min/{1.73_m2} (ref >59)

## 2021-10-04 ENCOUNTER — Encounter: Payer: Self-pay | Admitting: Cardiology

## 2021-10-04 ENCOUNTER — Ambulatory Visit: Payer: BC Managed Care – PPO | Admitting: Cardiology

## 2021-10-04 VITALS — BP 147/86 | HR 81 | Temp 98.0°F | Resp 17 | Ht 65.0 in | Wt 274.0 lb

## 2021-10-04 DIAGNOSIS — E782 Mixed hyperlipidemia: Secondary | ICD-10-CM | POA: Insufficient documentation

## 2021-10-04 DIAGNOSIS — I10 Essential (primary) hypertension: Secondary | ICD-10-CM | POA: Diagnosis not present

## 2021-10-04 DIAGNOSIS — I480 Paroxysmal atrial fibrillation: Secondary | ICD-10-CM

## 2021-10-04 MED ORDER — LISINOPRIL 10 MG PO TABS: 10.0000 mg | ORAL_TABLET | Freq: Every day | ORAL | 3 refills | Status: DC

## 2021-10-04 MED ORDER — SOTALOL HCL 80 MG PO TABS: 80.0000 mg | ORAL_TABLET | Freq: Two times a day (BID) | ORAL | 3 refills | Status: DC

## 2021-10-04 NOTE — Progress Notes (Signed)
? ? ?Patient is here for follow up visit. ? ?Subjective:  ? ?Amanda Graves, female    DOB: [birth year], 61 y.o.   MRN: [id number] ? ? ?Chief Complaint  ?Patient presents with  ? Follow-up  ?  1 YEAR  ? Paroxysmal atrial fibrillation (HCC)  ? ? ?61 year old Caucasian female with PAF, NSVT,  h/o presyncope. ? ?Patient ran out of sotalol and lisinopril last Saturday. On Tuesday, she had an episode of tachycardia up to 160 bpm lasting for a few min.  ? ? ?Current Outpatient Medications:  ?  Ascorbic Acid (VITAMIN C) 1000 MG tablet, Take 1,000 mg by mouth daily., Disp: , Rfl:  ?  b complex vitamins capsule, Take 1 capsule by mouth daily., Disp: , Rfl:  ?  lisinopril (ZESTRIL) 10 MG tablet, Take 1 tablet (10 mg total) by mouth daily., Disp: 90 tablet, Rfl: 3 ?  MELATONIN PO, Take by mouth., Disp: , Rfl:  ?  sotalol (BETAPACE) 80 MG tablet, Take 1 tablet (80 mg total) by mouth 2 (two) times daily., Disp: 180 tablet, Rfl: 3 ?  Vitamin D, Cholecalciferol, 25 MCG (1000 UT) CAPS, Take 2 capsules by mouth daily., Disp: , Rfl:  ? ? ? ?Cardiovascular studies: ? ?EKG 10/04/2021: ?Sinus rhythm 61 bpm  ?Low voltage in limb leads ?Normal QTc ? ?Event monitor alert 07/27/2018: ?Afib with RVR ? ?Hospital echocardiogram 06/28/2017: ? ?- Left ventricle: The cavity size was normal. Wall thickness was ?  increased in a pattern of mild LVH. Systolic function was normal. ?  The estimated ejection fraction was in the range of 60% to 65%. ?  Doppler parameters are consistent with abnormal left ventricular ?  relaxation (grade 1 diastolic dysfunction). ?- Atrial septum: No defect or patent foramen ovale was identified. ? ?Treadmill exercise stress test 06/29/2017: ?Indication: Syncope ?The patient exercised on Bruce protocol for  07:09 min. Patient achieved  8.80 METS and reached HR  197 bpm, which is  118 % of maximum age-predicted HR.  Stress test terminated due to fatigue. Arrhythmias: none.  BP Response to Exercise: Normal resting BP-  appropriate response. Chest Pain: none. ST Changes: With peak exercise there was no ST-T changes of ischemia. Exercise capacity was below average for age. HR Response to Exercise: Exaggerated HR response. Continue primary/secondary prevention. ? ?Recent labs: ?10/01/2021: ?Glucose 92, BUN/Cr 10/0.8. EGFR 85. Na/K 140/4.6. Rest of the CMP normal ?Chol 235, TG 127, HDL 57, LDL 155 ? ?08/28/2020: ?Glucose 99, BUN/Cr 11/0.96. EGFR 69. Na/K 141/4.2.  ?Chol 182, TG 115, HDL 48, LDL 113  ? ?08/29/2019: ?Glucose 87, BUN/Cr 9/0.78. EGFR 85. Na/K 144/4.2.  ? ? ?Review of Systems  ?Cardiovascular:  Negative for chest pain, dyspnea on exertion, leg swelling, palpitations and syncope.  ? ?   ?Objective:  ? ? ?Vitals:  ? 10/04/21 1355 10/04/21 1411  ?BP: (!) 143/82 (!) 147/86  ?Pulse: 80 81  ?Resp: 17   ?Temp: 98 ?F (36.7 ?C)   ?SpO2: 99% 99%  ?  ? ? Physical Exam ?Vitals and nursing note reviewed.  ?Constitutional:   ?   Appearance: She is well-developed.  ?Neck:  ?   Vascular: No JVD.  ?Cardiovascular:  ?   Rate and Rhythm: Normal rate and regular rhythm.  ?   Pulses: Intact distal pulses.  ?   Heart sounds: Normal heart sounds. No murmur heard. ?Pulmonary:  ?   Effort: Pulmonary effort is normal.  ?   Breath sounds: Normal breath sounds. No wheezing  or rales.  ? ? ? ?   ?Assessment & Recommendations:  ? ?61 year old Caucasian female with PAF, NSVT,  h/o presyncope. ? ?Paroxsymal atrial fibrillation: ?Maintaining sinus rhythm on sotalol 80 mg bid. Normal QTc interval.   ?One episode of suspect Afib a few days ago.  ?CHA2DS2VASc score 2. Annual stroke risk 2%. Anticoagulation not recommended (2019 ACC/AHA/HRS Afib guidelines) ?Refilled sotalol.  ? ?Hypertension: ?Refilled lisinopril 10 mg. ? ?Mixed hyperlipidemia: ?LDL 155. 10 yr ASCVD risk 5.0% ?Will check calcium score. ? ? ? ?NSVT (nonsustained ventricular tachycardia) ([place]) ?Episode in 06/2017. No recurrence ? ? ?F/u in 1 year.  ? ?Nigel Mormon, MD ?[facility]  [place] ?Pager: [phone] ?Office: [phone] ?If no answer Cell [phone] ?   ? ?

## 2021-11-04 ENCOUNTER — Ambulatory Visit: Payer: BC Managed Care – PPO | Admitting: Cardiology

## 2021-11-04 ENCOUNTER — Encounter: Payer: Self-pay | Admitting: Cardiology

## 2021-11-04 VITALS — BP 117/78 | HR 74 | Temp 98.4°F | Resp 17 | Ht 65.0 in | Wt 279.0 lb

## 2021-11-04 DIAGNOSIS — E782 Mixed hyperlipidemia: Secondary | ICD-10-CM

## 2021-11-04 DIAGNOSIS — I10 Essential (primary) hypertension: Secondary | ICD-10-CM | POA: Diagnosis not present

## 2021-11-04 DIAGNOSIS — I480 Paroxysmal atrial fibrillation: Secondary | ICD-10-CM

## 2021-11-04 MED ORDER — LISINOPRIL 10 MG PO TABS: 10.0000 mg | ORAL_TABLET | Freq: Every day | ORAL | 3 refills | Status: DC

## 2021-11-04 MED ORDER — SOTALOL HCL 80 MG PO TABS: 80.0000 mg | ORAL_TABLET | Freq: Two times a day (BID) | ORAL | 3 refills | Status: DC

## 2021-11-04 NOTE — Progress Notes (Signed)
Patient is here for follow up visit.  Subjective:   Amanda Graves, female    DOB: [birth year], 61 y.o.   MRN: [id number]   Chief Complaint  Patient presents with   PAF    1 MONTH    61 year old Caucasian female with PAF, NSVT,  h/o presyncope.  Patient is doing well. She now has an Apple watch, has not noticed any Afib episodes.    Current Outpatient Medications:    Ascorbic Acid (VITAMIN C) 1000 MG tablet, Take 1,000 mg by mouth daily., Disp: , Rfl:    b complex vitamins capsule, Take 1 capsule by mouth daily., Disp: , Rfl:    lisinopril (ZESTRIL) 10 MG tablet, Take 1 tablet (10 mg total) by mouth daily., Disp: 90 tablet, Rfl: 3   MELATONIN PO, Take by mouth., Disp: , Rfl:    sotalol (BETAPACE) 80 MG tablet, Take 1 tablet (80 mg total) by mouth 2 (two) times daily., Disp: 180 tablet, Rfl: 3   Vitamin D, Cholecalciferol, 25 MCG (1000 UT) CAPS, Take 2 capsules by mouth daily., Disp: , Rfl:     Cardiovascular studies:  EKG 11/04/2021: Sinus rhythm 63 bpm  Low voltage in limb leads Normal QTc interval  Event monitor alert 07/27/2018: Afib with RVR  Hospital echocardiogram 06/28/2017:  - Left ventricle: The cavity size was normal. Wall thickness was   increased in a pattern of mild LVH. Systolic function was normal.   The estimated ejection fraction was in the range of 60% to 65%.   Doppler parameters are consistent with abnormal left ventricular   relaxation (grade 1 diastolic dysfunction). - Atrial septum: No defect or patent foramen ovale was identified.  Treadmill exercise stress test 06/29/2017: Indication: Syncope The patient exercised on Bruce protocol for  07:09 min. Patient achieved  8.80 METS and reached HR  197 bpm, which is  118 % of maximum age-predicted HR.  Stress test terminated due to fatigue. Arrhythmias: none.  BP Response to Exercise: Normal resting BP- appropriate response. Chest Pain: none. ST Changes: With peak exercise there was no ST-T  changes of ischemia. Exercise capacity was below average for age. HR Response to Exercise: Exaggerated HR response. Continue primary/secondary prevention.  Recent labs: 10/01/2021: Glucose 92, BUN/Cr 10/0.8. EGFR 85. Na/K 140/4.6. Rest of the CMP normal Chol 235, TG 127, HDL 57, LDL 155  08/28/2020: Glucose 99, BUN/Cr 11/0.96. EGFR 69. Na/K 141/4.2.  Chol 182, TG 115, HDL 48, LDL 113   08/29/2019: Glucose 87, BUN/Cr 9/0.78. EGFR 85. Na/K 144/4.2.    Review of Systems  Cardiovascular:  Negative for chest pain, dyspnea on exertion, leg swelling, palpitations and syncope.       Objective:    There were no vitals filed for this visit.     Physical Exam Vitals and nursing note reviewed.  Constitutional:      Appearance: She is well-developed.  Neck:     Vascular: No JVD.  Cardiovascular:     Rate and Rhythm: Normal rate and regular rhythm.     Pulses: Intact distal pulses.     Heart sounds: Normal heart sounds. No murmur heard. Pulmonary:     Effort: Pulmonary effort is normal.     Breath sounds: Normal breath sounds. No wheezing or rales.         Assessment & Recommendations:   61 year old Caucasian female with PAF, NSVT,  h/o presyncope.  Paroxsymal atrial fibrillation: Maintaining sinus rhythm on sotalol 80 mg bid. Normal QTc interval.  One episode of suspect Afib5/2023 with no known recurrence. CHA2DS2VASc score 2. Annual stroke risk 2%. Anticoagulation not recommended (2019 ACC/AHA/HRS Afib guidelines) Continue  sotalol 80 mg bid. Refilled for 3 months through [facility].  Hypertension: Well controlled on lisinopril 10 mg. Refilled for 3 months through [facility].  Mixed hyperlipidemia: LDL 155. 10 yr ASCVD risk 5.0% Calcium score pending.  NSVT (nonsustained ventricular tachycardia) (HCC) Episode in 06/2017. No recurrence  F/u in 1 year.   Manish J Patwardhan, MD [facility] Pager: [phone] Office: [phone] If no answer  Cell [phone]     

## 2021-12-20 ENCOUNTER — Ambulatory Visit
Admission: RE | Admit: 2021-12-20 | Discharge: 2021-12-20 | Disposition: A | Discharge status: Home / Self Care | Payer: No Typology Code available for payment source | Source: Ambulatory Visit | Attending: Cardiology | Admitting: Cardiology

## 2021-12-20 DIAGNOSIS — E782 Mixed hyperlipidemia: Secondary | ICD-10-CM

## 2021-12-24 ENCOUNTER — Encounter: Payer: Self-pay | Admitting: Cardiology

## 2021-12-25 ENCOUNTER — Other Ambulatory Visit: Payer: Self-pay | Admitting: Cardiology

## 2021-12-25 DIAGNOSIS — E782 Mixed hyperlipidemia: Secondary | ICD-10-CM

## 2021-12-25 MED ORDER — ROSUVASTATIN CALCIUM 20 MG PO TABS: 20.0000 mg | ORAL_TABLET | Freq: Every day | ORAL | 3 refills | Status: DC

## 2021-12-25 NOTE — Telephone Encounter (Signed)
From pt

## 2022-07-18 DIAGNOSIS — Z1231 Encounter for screening mammogram for malignant neoplasm of breast: Secondary | ICD-10-CM | POA: Diagnosis not present

## 2022-10-24 ENCOUNTER — Other Ambulatory Visit: Payer: Self-pay | Admitting: Cardiology

## 2022-10-24 DIAGNOSIS — I480 Paroxysmal atrial fibrillation: Secondary | ICD-10-CM

## 2022-10-24 DIAGNOSIS — I10 Essential (primary) hypertension: Secondary | ICD-10-CM

## 2022-11-05 DIAGNOSIS — E782 Mixed hyperlipidemia: Secondary | ICD-10-CM | POA: Diagnosis not present

## 2022-11-06 LAB — LIPID PANEL
Chol/HDL Ratio: 3 ratio (ref 0.0–4.4)
Cholesterol, Total: 164 mg/dL (ref 100–199)
HDL: 55 mg/dL (ref >39)
LDL Chol Calc (NIH): 89 mg/dL (ref 0–99)
Triglycerides: 113 mg/dL (ref 0–149)
VLDL Cholesterol Cal: 20 mg/dL (ref 5–40)

## 2022-11-07 ENCOUNTER — Ambulatory Visit: Payer: BC Managed Care – PPO | Admitting: Cardiology

## 2022-11-10 ENCOUNTER — Ambulatory Visit: Payer: BC Managed Care – PPO | Admitting: Cardiology

## 2022-11-10 ENCOUNTER — Encounter: Payer: Self-pay | Admitting: Cardiology

## 2022-11-10 VITALS — BP 115/71 | HR 65 | Ht 65.0 in | Wt 279.0 lb

## 2022-11-10 DIAGNOSIS — I10 Essential (primary) hypertension: Secondary | ICD-10-CM | POA: Diagnosis not present

## 2022-11-10 DIAGNOSIS — R931 Abnormal findings on diagnostic imaging of heart and coronary circulation: Secondary | ICD-10-CM | POA: Insufficient documentation

## 2022-11-10 DIAGNOSIS — E782 Mixed hyperlipidemia: Secondary | ICD-10-CM

## 2022-11-10 DIAGNOSIS — I480 Paroxysmal atrial fibrillation: Secondary | ICD-10-CM | POA: Diagnosis not present

## 2022-11-10 MED ORDER — SOTALOL HCL 80 MG PO TABS: 80.0000 mg | ORAL_TABLET | Freq: Two times a day (BID) | ORAL | 6 refills | Status: DC

## 2022-11-10 MED ORDER — SOTALOL HCL 80 MG PO TABS
80.0000 mg | ORAL_TABLET | Freq: Two times a day (BID) | ORAL | 5 refills | Status: DC
Start: 2022-11-10 — End: 2023-11-26

## 2022-11-10 MED ORDER — ROSUVASTATIN CALCIUM 40 MG PO TABS
40.0000 mg | ORAL_TABLET | Freq: Every day | ORAL | 6 refills | Status: DC
Start: 2022-11-10 — End: 2022-11-10

## 2022-11-10 MED ORDER — LISINOPRIL 10 MG PO TABS: 10.0000 mg | ORAL_TABLET | Freq: Every day | ORAL | 5 refills | Status: DC

## 2022-11-10 MED ORDER — LISINOPRIL 10 MG PO TABS: 10.0000 mg | ORAL_TABLET | Freq: Every day | ORAL | 6 refills | Status: DC

## 2022-11-10 MED ORDER — ROSUVASTATIN CALCIUM 40 MG PO TABS
40.0000 mg | ORAL_TABLET | Freq: Every day | ORAL | 5 refills | Status: DC
Start: 2022-11-10 — End: 2023-11-26

## 2022-11-10 NOTE — Progress Notes (Signed)
Patient is here for follow up visit.  Subjective:   Amanda Graves, female    DOB: Oct 10, 1960, 62 y.o.   MRN: [id number]   Chief Complaint  Patient presents with   Paroxysmal atrial fibrillation [facility])   Follow-up    62 year old Caucasian female with PAF, NSVT,  h/o presyncope.  Patient is doing well. She has had only occasional palpitations symptoms. Reviewed recent test results with the patient, details below.    Current Outpatient Medications:    Ascorbic Acid (VITAMIN C) 1000 MG tablet, Take 1,000 mg by mouth daily., Disp: , Rfl:    b complex vitamins capsule, Take 1 capsule by mouth daily., Disp: , Rfl:    lisinopril (ZESTRIL) 10 MG tablet, TAKE 1 TABLET DAILY, Disp: 90 tablet, Rfl: 3   magnesium 30 MG tablet, Take 30 mg by mouth 2 (two) times daily., Disp: , Rfl:    Melatonin 1 MG CAPS, Take by mouth., Disp: , Rfl:    MELATONIN PO, Take by mouth., Disp: , Rfl:    Multiple Vitamin (MULTIVITAMIN ADULT PO), Take by mouth., Disp: , Rfl:    rosuvastatin (CRESTOR) 20 MG tablet, Take 1 tablet (20 mg total) by mouth daily., Disp: 90 tablet, Rfl: 3   sotalol (BETAPACE) 80 MG tablet, TAKE 1 TABLET TWICE A DAY, Disp: 180 tablet, Rfl: 6   Vitamin D, Cholecalciferol, 25 MCG (1000 UT) CAPS, Take 2 capsules by mouth daily., Disp: , Rfl:     Cardiovascular studies:  EKG 11/04/2021: Sinus rhythm 63 bpm  Low voltage in limb leads Normal QTc interval  CT cardiac scoring 12/20/2021: LM: 0 LAD: 430 LCx: 37.2 RCA: 1 24 Total Agatston Score: 591   MESA database percentile: 95   AORTA MEASUREMENTS: Ascending Aorta: 3.5 cm Descending Aorta: 2.5 cm  Event monitor alert 07/27/2018: Afib with RVR  Hospital echocardiogram 06/28/2017:  - Left ventricle: The cavity size was normal. Wall thickness was   increased in a pattern of mild LVH. Systolic function was normal.   The estimated ejection fraction was in the range of 60% to 65%.   Doppler parameters are consistent with  abnormal left ventricular   relaxation (grade 1 diastolic dysfunction). - Atrial septum: No defect or patent foramen ovale was identified.  Treadmill exercise stress test 06/29/2017: Indication: Syncope The patient exercised on Bruce protocol for  07:09 min. Patient achieved  8.80 METS and reached HR  197 bpm, which is  118 % of maximum age-predicted HR.  Stress test terminated due to fatigue. Arrhythmias: none.  BP Response to Exercise: Normal resting BP- appropriate response. Chest Pain: none. ST Changes: With peak exercise there was no ST-T changes of ischemia. Exercise capacity was below average for age. HR Response to Exercise: Exaggerated HR response. Continue primary/secondary prevention.  Recent labs: 11/05/2022: Chol 164, TG 113, HDL 55, LDL 89  10/01/2021: Glucose 92, BUN/Cr 10/0.8. EGFR 85. Na/K 140/4.6. Rest of the CMP normal Chol 235, TG 127, HDL 57, LDL 155  08/28/2020: Glucose 99, BUN/Cr 11/0.96. EGFR 69. Na/K 141/4.2.  Chol 182, TG 115, HDL 48, LDL 113   08/29/2019: Glucose 87, BUN/Cr 9/0.78. EGFR 85. Na/K 144/4.2.    Review of Systems  Cardiovascular:  Negative for chest pain, dyspnea on exertion, leg swelling, palpitations and syncope.       Objective:    Vitals:   11/10/22 1304  BP: 115/71  Pulse: 65  SpO2: 97%      Physical Exam Vitals and nursing note reviewed.  Constitutional:      Appearance: She is well-developed.  Neck:     Vascular: No JVD.  Cardiovascular:     Rate and Rhythm: Normal rate and regular rhythm.     Pulses: Intact distal pulses.     Heart sounds: Normal heart sounds. No murmur heard. Pulmonary:     Effort: Pulmonary effort is normal.     Breath sounds: Normal breath sounds. No wheezing or rales.  Musculoskeletal:     Right lower leg: No edema.     Left lower leg: No edema.         Assessment & Recommendations:   62 year old Caucasian female with PAF, NSVT,  h/o presyncope.  Paroxsymal atrial fibrillation: Maintaining  sinus rhythm on sotalol 80 mg bid. Normal QTc interval.   CHA2DS2VASc score 2. Annual stroke risk 2%. Anticoagulation not recommended (2019 ACC/AHA/HRS Afib guidelines) Continue  sotalol 80 mg bid. Refilled for 3 months through [facility].  Hypertension: Well controlled on lisinopril 10 mg. Refilled for 3 months through [facility].  Mixed hyperlipidemia: Ca score 591. No angina symptoms. Will obtain exercise nuclear stress test for risk stratification. LDL down from 155 to 89. Increase rosuvastatin to 10 mg daily. Refilled for 3 months through [facility]. Repeat lipid panel, and BMP in Aug/Sep 2024.  NSVT (nonsustained ventricular tachycardia) (HCC) Episode in 06/2017. No recurrence  F/u in 1 year.    Elder Negus, MD Pager: [phone] Office: [phone]

## 2023-01-28 NOTE — Telephone Encounter (Signed)
Left message on machine for patient to call back to schedule an appt w/Dr Rosemary Holms this week

## 2023-02-05 ENCOUNTER — Other Ambulatory Visit: Payer: BC Managed Care – PPO

## 2023-02-09 ENCOUNTER — Other Ambulatory Visit: Payer: BC Managed Care – PPO

## 2023-07-08 ENCOUNTER — Other Ambulatory Visit (HOSPITAL_BASED_OUTPATIENT_CLINIC_OR_DEPARTMENT_OTHER): Payer: Self-pay

## 2023-07-08 MED ORDER — ZEPBOUND 2.5 MG/0.5ML ~~LOC~~ SOAJ
2.5000 mg | SUBCUTANEOUS | 0 refills | Status: DC
  Filled 2023-07-08: qty 2, 28d supply, fill #0

## 2023-07-15 ENCOUNTER — Other Ambulatory Visit (HOSPITAL_BASED_OUTPATIENT_CLINIC_OR_DEPARTMENT_OTHER): Payer: Self-pay

## 2023-07-17 ENCOUNTER — Other Ambulatory Visit (HOSPITAL_BASED_OUTPATIENT_CLINIC_OR_DEPARTMENT_OTHER): Payer: Self-pay

## 2023-07-17 MED ORDER — ZEPBOUND 2.5 MG/0.5ML ~~LOC~~ SOAJ
SUBCUTANEOUS | 1 refills | Status: DC
  Filled 2023-07-17: qty 2, 28d supply, fill #0

## 2023-07-27 ENCOUNTER — Other Ambulatory Visit (HOSPITAL_BASED_OUTPATIENT_CLINIC_OR_DEPARTMENT_OTHER): Payer: Self-pay

## 2023-08-07 ENCOUNTER — Other Ambulatory Visit (HOSPITAL_BASED_OUTPATIENT_CLINIC_OR_DEPARTMENT_OTHER): Payer: Self-pay

## 2023-08-07 MED ORDER — ZEPBOUND 2.5 MG/0.5ML ~~LOC~~ SOAJ
2.5000 mg | SUBCUTANEOUS | 1 refills | Status: DC
  Filled 2023-08-07 – 2023-08-12 (×2): qty 2, 28d supply, fill #0
  Filled 2023-09-05 (×2): qty 2, 28d supply, fill #1

## 2023-08-12 ENCOUNTER — Other Ambulatory Visit (HOSPITAL_BASED_OUTPATIENT_CLINIC_OR_DEPARTMENT_OTHER): Payer: Self-pay

## 2023-08-13 ENCOUNTER — Other Ambulatory Visit (HOSPITAL_BASED_OUTPATIENT_CLINIC_OR_DEPARTMENT_OTHER): Payer: Self-pay

## 2023-09-05 ENCOUNTER — Other Ambulatory Visit (HOSPITAL_BASED_OUTPATIENT_CLINIC_OR_DEPARTMENT_OTHER): Payer: Self-pay

## 2023-09-08 ENCOUNTER — Other Ambulatory Visit (HOSPITAL_BASED_OUTPATIENT_CLINIC_OR_DEPARTMENT_OTHER): Payer: Self-pay

## 2023-11-13 ENCOUNTER — Ambulatory Visit: Payer: Self-pay | Admitting: Cardiology

## 2023-11-25 ENCOUNTER — Other Ambulatory Visit: Payer: Self-pay

## 2023-11-25 DIAGNOSIS — E782 Mixed hyperlipidemia: Secondary | ICD-10-CM

## 2023-11-25 DIAGNOSIS — R931 Abnormal findings on diagnostic imaging of heart and coronary circulation: Secondary | ICD-10-CM

## 2023-11-25 DIAGNOSIS — I480 Paroxysmal atrial fibrillation: Secondary | ICD-10-CM

## 2023-11-25 LAB — LIPID PANEL

## 2023-11-26 ENCOUNTER — Ambulatory Visit: Payer: Self-pay | Attending: Cardiology | Admitting: Cardiology

## 2023-11-26 ENCOUNTER — Encounter: Payer: Self-pay | Admitting: Cardiology

## 2023-11-26 ENCOUNTER — Telehealth: Payer: Self-pay

## 2023-11-26 VITALS — BP 110/70 | Ht 65.0 in | Wt 268.8 lb

## 2023-11-26 DIAGNOSIS — I480 Paroxysmal atrial fibrillation: Secondary | ICD-10-CM

## 2023-11-26 DIAGNOSIS — E782 Mixed hyperlipidemia: Secondary | ICD-10-CM

## 2023-11-26 DIAGNOSIS — I10 Essential (primary) hypertension: Secondary | ICD-10-CM

## 2023-11-26 DIAGNOSIS — R931 Abnormal findings on diagnostic imaging of heart and coronary circulation: Secondary | ICD-10-CM

## 2023-11-26 LAB — BASIC METABOLIC PANEL WITH GFR
BUN/Creatinine Ratio: 20 (ref 12–28)
BUN: 17 mg/dL (ref 8–27)
CO2: 21 mmol/L (ref 20–29)
Calcium: 9.2 mg/dL (ref 8.7–10.3)
Chloride: 104 mmol/L (ref 96–106)
Creatinine, Ser: 0.84 mg/dL (ref 0.57–1.00)
Glucose: 87 mg/dL (ref 70–99)
Potassium: 4.2 mmol/L (ref 3.5–5.2)
Sodium: 142 mmol/L (ref 134–144)
eGFR: 80 mL/min/{1.73_m2} (ref >59)

## 2023-11-26 LAB — LIPID PANEL
Cholesterol, Total: 115 mg/dL (ref 100–199)
HDL: 46 mg/dL (ref >39)
LDL CALC COMMENT:: 2.5 ratio (ref 0.0–4.4)
LDL Chol Calc (NIH): 50 mg/dL (ref 0–99)
Triglycerides: 98 mg/dL (ref 0–149)
VLDL Cholesterol Cal: 19 mg/dL (ref 5–40)

## 2023-11-26 MED ORDER — APIXABAN 5 MG PO TABS: 5.0000 mg | ORAL_TABLET | Freq: Two times a day (BID) | ORAL | 0 refills | Status: DC

## 2023-11-26 MED ORDER — SOTALOL HCL 80 MG PO TABS
80.0000 mg | ORAL_TABLET | Freq: Two times a day (BID) | ORAL | 3 refills | Status: AC
Start: 2023-11-26 — End: ?

## 2023-11-26 MED ORDER — APIXABAN 5 MG PO TABS: 5.0000 mg | ORAL_TABLET | Freq: Two times a day (BID) | ORAL | 3 refills | Status: DC

## 2023-11-26 MED ORDER — ROSUVASTATIN CALCIUM 40 MG PO TABS: 40.0000 mg | ORAL_TABLET | Freq: Every day | ORAL | 3 refills | Status: AC

## 2023-11-26 MED ORDER — LISINOPRIL 10 MG PO TABS: 10.0000 mg | ORAL_TABLET | Freq: Every day | ORAL | 3 refills | Status: AC

## 2023-11-26 NOTE — Telephone Encounter (Signed)
 Medication name/dosage: Samples List: Eliquis 5 mg  Administration instructions: Take one tablet by mouth twice daily   Reason for samples: Reason for samples: new start  Ordering provider:Dr. Elmira   *Once above information entered, route the phone encounter to [facility] SAMPLES and send Teams message to team member assigned to Samples for the day.

## 2023-11-26 NOTE — Patient Instructions (Signed)
 Medication Instructions:  START Eliquis 5 mg twice daily *If you need a refill on your cardiac medications before your next appointment, please call your pharmacy*  Follow-Up: At [facility], you and your health needs are our priority.  As part of our continuing mission to provide you with exceptional heart care, our providers are all part of one team.  This team includes your primary Cardiologist (physician) and Advanced Practice Providers or APPs (Physician Assistants and Nurse Practitioners) who all work together to provide you with the care you need, when you need it.  Your next appointment:   1 Year  Provider:   Newman JINNY Lawrence, MD    We recommend signing up for the patient portal called MyChart.  Sign up information is provided on this After Visit Summary.  MyChart is used to connect with patients for Virtual Visits (Telemedicine).  Patients are able to view lab/test results, encounter notes, upcoming appointments, etc.  Non-urgent messages can be sent to your provider as well.   To learn more about what you can do with MyChart, go to [email or web address].

## 2023-11-26 NOTE — Telephone Encounter (Signed)
 Eliquis 5 mg by mouth twice daily for 28 days samples is given.

## 2023-11-26 NOTE — Progress Notes (Signed)
 Cardiology Office Note:  .   Date:  11/26/2023  ID:  Amanda Graves, DOB 23-Mar-1961, MRN [id number] PCP: Jacques Camie Pepper, PA-C  [facility] Providers Cardiologist:  Newman Lawrence, MD PCP: Jacques Camie Pepper, PA-C  Chief Complaint  Patient presents with   Hypertension   PAF   Hyperlipidemia   NSVT     Amanda Graves is a 63 y.o. female with hypertension, hyperlipidemia, PAF, NSVT   History of Present Illness  Patient is doing well.  She had 1 episode of A-fib after alcohol intake, but has had no other recurrence.  She did have an Apple Watch alerted regarding her A-fib episode.  She is compliant with her medical therapy.  Reviewed recent treatment results with the patient, details below.    Vitals:   11/26/23 1520  BP: 110/70  SpO2: 95%      Review of Systems  Cardiovascular:  Negative for chest pain, dyspnea on exertion, leg swelling, palpitations and syncope.        Studies Reviewed: SABRA        EKG 11/26/2023: Normal sinus rhythm Low voltage QRS Normal QTc interval When compared with ECG of 27-Jun-2017 17:17, No significant change was found    Labs 11/2023: Chol 115, TG 98, HDL 46, LDL 50 HbA1C 14.3% Hb 14.3 Cr 0.84  09/2021: Chol 164, TG 113, HDL 55, LDL 89   10/01/2021: Chol 235, TG 127, HDL 57, LDL 155    Media Information Afib episode 2020     Risk Assessment/Calculations:    CHA2DS2-VASc Score = 3   This indicates a 3.2% annual risk of stroke. The patient's score is based upon: CHF History: 0 HTN History: 1 Diabetes History: 0 Stroke History: 0 Vascular Disease History: 1 Age Score: 0 Gender Score: 1     Physical Exam Vitals and nursing note reviewed.  Constitutional:      General: She is not in acute distress. Neck:     Vascular: No JVD.  Cardiovascular:     Rate and Rhythm: Normal rate and regular rhythm.     Heart sounds: Normal heart sounds. No murmur heard. Pulmonary:     Effort: Pulmonary  effort is normal.     Breath sounds: Normal breath sounds. No wheezing or rales.  Musculoskeletal:     Right lower leg: No edema.     Left lower leg: No edema.      VISIT DIAGNOSES:   ICD-10-CM   1. Paroxysmal atrial fibrillation (HCC)  I48.0 EKG 12-Lead    2. Agatston coronary artery calcium  score greater than 400  R93.1     3. Mixed hyperlipidemia  E78.2     4. Essential hypertension  I10 EKG 12-Lead       Amanda Graves is a 63 y.o. female with hypertension, hyperlipidemia, PAF, NSVT Assessment & Plan  Paroxsymal atrial fibrillation: Maintaining sinus rhythm on sotalol  80 mg bid. Normal QTc interval.   With hypertension, female gender, and severe coronary calcification (even though no significant aortic atherosclerosis), I would count her CHA2DS2-VASc 4 is 3 with annual stroke risk of 3.2%.  Discuss benefits and risks of anticoagulation.  Her stroke risk will only increase as she gets older, particularly after 63 years of age.  She is still does have occasional A-fib episodes.  After much discussion with the patient, we decided to proceed with anticoagulation with Eliquis 5 mg twice daily. Continue sotalol  80 mg twice daily, which is controlling her A-fib fairly well with  only breakthrough episode occasionally. Continue weight loss efforts with diet, lifestyle, and Zepbound .  Hypertension: Well controlled on lisinopril  10 mg.   Mixed hyperlipidemia: Ca score 591. No angina symptoms. LDL 155 (2023)-->50 (2025) on Crestor  40 mg daily.  Continue the same.   NSVT (nonsustained ventricular tachycardia) (HCC) Episode in 06/2017. No recurrence      Meds ordered this encounter  Medications   apixaban (ELIQUIS) 5 MG TABS tablet    Sig: Take 1 tablet (5 mg total) by mouth 2 (two) times daily.    Dispense:  180 tablet    Refill:  3   lisinopril  (ZESTRIL ) 10 MG tablet    Sig: Take 1 tablet (10 mg total) by mouth daily.    Dispense:  90 tablet    Refill:  3    rosuvastatin  (CRESTOR ) 40 MG tablet    Sig: Take 1 tablet (40 mg total) by mouth daily.    Dispense:  90 tablet    Refill:  3   sotalol  (BETAPACE ) 80 MG tablet    Sig: Take 1 tablet (80 mg total) by mouth 2 (two) times daily.    Dispense:  180 tablet    Refill:  3     F/u in 1 year  Signed, Newman JINNY Lawrence, MD

## 2024-01-07 ENCOUNTER — Other Ambulatory Visit (HOSPITAL_COMMUNITY): Payer: Self-pay

## 2024-01-07 ENCOUNTER — Encounter: Payer: Self-pay | Admitting: Cardiology

## 2024-01-07 DIAGNOSIS — I480 Paroxysmal atrial fibrillation: Secondary | ICD-10-CM

## 2024-01-07 MED ORDER — APIXABAN 5 MG PO TABS
5.0000 mg | ORAL_TABLET | Freq: Two times a day (BID) | ORAL | 5 refills | Status: AC
  Filled 2024-01-07: qty 60, 30d supply, fill #0
  Filled 2024-02-05: qty 60, 30d supply, fill #1
  Filled 2024-03-08: qty 60, 30d supply, fill #2
  Filled 2024-04-06: qty 60, 30d supply, fill #3
  Filled 2024-05-10: qty 60, 30d supply, fill #4
  Filled 2024-06-10: qty 60, 30d supply, fill #5

## 2024-01-07 MED ORDER — APIXABAN 5 MG PO TABS
5.0000 mg | ORAL_TABLET | Freq: Two times a day (BID) | ORAL | 1 refills | Status: DC
Start: 2024-01-07 — End: 2024-01-07

## 2024-01-07 NOTE — Addendum Note (Signed)
 Addended by: JOESPH CHROMAN B on: 01/07/2024 12:51 PM   Modules accepted: Orders

## 2024-01-07 NOTE — Telephone Encounter (Signed)
 Prescription refill request for Eliquis  received. Indication: Afib  Last office visit: 11/26/23 (Patwardhan)  Scr: 0.84 (11/25/23)  Age: 63 Weight: 121.9kg  Appropriate dose. Refill sent.

## 2024-01-11 ENCOUNTER — Other Ambulatory Visit (HOSPITAL_COMMUNITY): Payer: Self-pay

## 2024-01-13 ENCOUNTER — Other Ambulatory Visit (HOSPITAL_COMMUNITY): Payer: Self-pay

## 2024-02-05 ENCOUNTER — Other Ambulatory Visit (HOSPITAL_COMMUNITY): Payer: Self-pay

## 2024-03-11 ENCOUNTER — Other Ambulatory Visit (HOSPITAL_COMMUNITY): Payer: Self-pay

## 2024-05-10 ENCOUNTER — Other Ambulatory Visit (HOSPITAL_COMMUNITY): Payer: Self-pay

## 2024-05-11 ENCOUNTER — Other Ambulatory Visit (HOSPITAL_COMMUNITY): Payer: Self-pay
